# Patient Record
Sex: Male | Born: 1935 | Race: Black or African American | Hispanic: No | Marital: Married | Smoking: Former smoker
Health system: Southern US, Community
[De-identification: ages and names within clinical notes are randomized; demographics above are authoritative.]

## PROBLEM LIST (undated history)

## (undated) DIAGNOSIS — D75839 Thrombocytosis, unspecified: Secondary | ICD-10-CM

## (undated) DIAGNOSIS — D473 Essential (hemorrhagic) thrombocythemia: Secondary | ICD-10-CM

## (undated) DIAGNOSIS — E785 Hyperlipidemia, unspecified: Secondary | ICD-10-CM

## (undated) HISTORY — DX: Thrombocytosis, unspecified: D75.839

## (undated) HISTORY — PX: TONSILLECTOMY: SUR1361

## (undated) HISTORY — DX: Hyperlipidemia, unspecified: E78.5

## (undated) HISTORY — DX: Essential (hemorrhagic) thrombocythemia: D47.3

---
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
## 1998-01-31 HISTORY — PX: CARDIAC CATHETERIZATION: SHX172

## 1998-04-27 ENCOUNTER — Encounter: Payer: Self-pay | Admitting: Emergency Medicine

## 1998-04-27 ENCOUNTER — Inpatient Hospital Stay (HOSPITAL_COMMUNITY): Admission: EM | Admit: 1998-04-27 | Discharge: 1998-04-30 | Payer: Self-pay | Admitting: Emergency Medicine

## 2000-03-23 ENCOUNTER — Encounter: Payer: Self-pay | Admitting: Family Medicine

## 2000-03-23 ENCOUNTER — Encounter: Admission: RE | Admit: 2000-03-23 | Discharge: 2000-03-23 | Payer: Self-pay | Admitting: Family Medicine

## 2004-02-04 ENCOUNTER — Ambulatory Visit: Payer: Self-pay | Admitting: Oncology

## 2004-02-18 ENCOUNTER — Emergency Department (HOSPITAL_COMMUNITY): Admission: EM | Admit: 2004-02-18 | Discharge: 2004-02-18 | Payer: Self-pay | Admitting: Emergency Medicine

## 2004-05-04 ENCOUNTER — Ambulatory Visit: Payer: Self-pay | Admitting: Oncology

## 2004-08-02 ENCOUNTER — Ambulatory Visit: Payer: Self-pay | Admitting: Oncology

## 2004-11-02 ENCOUNTER — Ambulatory Visit: Payer: Self-pay | Admitting: Oncology

## 2004-12-27 ENCOUNTER — Encounter: Admission: RE | Admit: 2004-12-27 | Discharge: 2004-12-27 | Payer: Self-pay | Admitting: Family Medicine

## 2005-01-05 ENCOUNTER — Ambulatory Visit: Payer: Self-pay | Admitting: Oncology

## 2005-05-25 ENCOUNTER — Ambulatory Visit: Payer: Self-pay | Admitting: Oncology

## 2005-05-25 LAB — CBC WITH DIFFERENTIAL/PLATELET
Basophils Absolute: 0 10*3/uL (ref 0.0–0.1)
EOS%: 1.8 % (ref 0.0–7.0)
Eosinophils Absolute: 0.1 10*3/uL (ref 0.0–0.5)
HCT: 37.8 % — ABNORMAL LOW (ref 38.7–49.9)
HGB: 12.4 g/dL — ABNORMAL LOW (ref 13.0–17.1)
MONO#: 0.6 10*3/uL (ref 0.1–0.9)
NEUT#: 4.4 10*3/uL (ref 1.5–6.5)
NEUT%: 71.6 % (ref 40.0–75.0)
RDW: 15 % — ABNORMAL HIGH (ref 11.2–14.6)
WBC: 6.1 10*3/uL (ref 4.0–10.0)
lymph#: 1 10*3/uL (ref 0.9–3.3)

## 2005-06-03 LAB — CBC WITH DIFFERENTIAL/PLATELET
BASO%: 1 % (ref 0.0–2.0)
EOS%: 1.7 % (ref 0.0–7.0)
HCT: 42 % (ref 38.7–49.9)
MCH: 30.9 pg (ref 28.0–33.4)
MCHC: 33.5 g/dL (ref 32.0–35.9)
MONO#: 0.8 10*3/uL (ref 0.1–0.9)
NEUT%: 68.1 % (ref 40.0–75.0)
RDW: 14.8 % — ABNORMAL HIGH (ref 11.2–14.6)
WBC: 7.1 10*3/uL (ref 4.0–10.0)
lymph#: 1.3 10*3/uL (ref 0.9–3.3)

## 2005-08-02 ENCOUNTER — Ambulatory Visit: Payer: Self-pay | Admitting: Oncology

## 2005-08-10 LAB — CBC WITH DIFFERENTIAL/PLATELET
BASO%: 0.4 % (ref 0.0–2.0)
EOS%: 2.1 % (ref 0.0–7.0)
LYMPH%: 22.3 % (ref 14.0–48.0)
MCH: 31.6 pg (ref 28.0–33.4)
MCHC: 33.3 g/dL (ref 32.0–35.9)
MONO#: 0.8 10*3/uL (ref 0.1–0.9)
NEUT%: 62.3 % (ref 40.0–75.0)
Platelets: 256 10*3/uL (ref 145–400)
RBC: 4.06 10*6/uL — ABNORMAL LOW (ref 4.20–5.71)
WBC: 6.2 10*3/uL (ref 4.0–10.0)
lymph#: 1.4 10*3/uL (ref 0.9–3.3)

## 2005-11-02 ENCOUNTER — Ambulatory Visit: Payer: Self-pay | Admitting: Oncology

## 2005-11-23 ENCOUNTER — Encounter: Admission: RE | Admit: 2005-11-23 | Discharge: 2005-11-23 | Payer: Self-pay | Admitting: Family Medicine

## 2006-01-20 ENCOUNTER — Ambulatory Visit: Payer: Self-pay | Admitting: Gastroenterology

## 2006-02-02 ENCOUNTER — Ambulatory Visit: Payer: Self-pay | Admitting: Oncology

## 2006-02-08 ENCOUNTER — Ambulatory Visit: Payer: Self-pay | Admitting: Gastroenterology

## 2006-03-01 LAB — CBC WITH DIFFERENTIAL/PLATELET
Basophils Absolute: 0 10*3/uL (ref 0.0–0.1)
Eosinophils Absolute: 0.2 10*3/uL (ref 0.0–0.5)
HCT: 36.7 % — ABNORMAL LOW (ref 38.7–49.9)
HGB: 12.3 g/dL — ABNORMAL LOW (ref 13.0–17.1)
MCH: 31.8 pg (ref 28.0–33.4)
MCV: 95.1 fL (ref 81.6–98.0)
MONO%: 12.6 % (ref 0.0–13.0)
NEUT#: 3.3 10*3/uL (ref 1.5–6.5)
NEUT%: 63.7 % (ref 40.0–75.0)
Platelets: 239 10*3/uL (ref 145–400)
RDW: 15.2 % — ABNORMAL HIGH (ref 11.2–14.6)

## 2006-04-26 ENCOUNTER — Encounter: Admission: RE | Admit: 2006-04-26 | Discharge: 2006-04-26 | Payer: Self-pay | Admitting: Family Medicine

## 2006-05-05 ENCOUNTER — Ambulatory Visit: Payer: Self-pay | Admitting: Oncology

## 2006-05-15 LAB — CBC WITH DIFFERENTIAL/PLATELET
Basophils Absolute: 0 10*3/uL (ref 0.0–0.1)
EOS%: 2 % (ref 0.0–7.0)
Eosinophils Absolute: 0.1 10*3/uL (ref 0.0–0.5)
LYMPH%: 12.7 % — ABNORMAL LOW (ref 14.0–48.0)
MCH: 32.1 pg (ref 28.0–33.4)
MCV: 94.6 fL (ref 81.6–98.0)
MONO%: 10.8 % (ref 0.0–13.0)
NEUT#: 5.2 10*3/uL (ref 1.5–6.5)
Platelets: 293 10*3/uL (ref 145–400)
RBC: 3.77 10*6/uL — ABNORMAL LOW (ref 4.20–5.71)

## 2006-08-03 ENCOUNTER — Ambulatory Visit: Payer: Self-pay | Admitting: Oncology

## 2006-08-09 LAB — CBC WITH DIFFERENTIAL/PLATELET
BASO%: 0.5 % (ref 0.0–2.0)
Basophils Absolute: 0 10*3/uL (ref 0.0–0.1)
Eosinophils Absolute: 0.1 10*3/uL (ref 0.0–0.5)
HCT: 37.4 % — ABNORMAL LOW (ref 38.7–49.9)
HGB: 12.7 g/dL — ABNORMAL LOW (ref 13.0–17.1)
LYMPH%: 22.1 % (ref 14.0–48.0)
MCHC: 33.8 g/dL (ref 32.0–35.9)
MONO#: 0.7 10*3/uL (ref 0.1–0.9)
NEUT%: 62.1 % (ref 40.0–75.0)
Platelets: 237 10*3/uL (ref 145–400)
WBC: 5.5 10*3/uL (ref 4.0–10.0)
lymph#: 1.2 10*3/uL (ref 0.9–3.3)

## 2006-11-03 ENCOUNTER — Ambulatory Visit: Payer: Self-pay | Admitting: Oncology

## 2006-11-07 LAB — CBC WITH DIFFERENTIAL/PLATELET
BASO%: 1.1 % (ref 0.0–2.0)
Basophils Absolute: 0.1 10*3/uL (ref 0.0–0.1)
EOS%: 2.1 % (ref 0.0–7.0)
HCT: 38.2 % — ABNORMAL LOW (ref 38.7–49.9)
HGB: 12.8 g/dL — ABNORMAL LOW (ref 13.0–17.1)
LYMPH%: 19.5 % (ref 14.0–48.0)
MCH: 32.2 pg (ref 28.0–33.4)
MCHC: 33.6 g/dL (ref 32.0–35.9)
MCV: 95.9 fL (ref 81.6–98.0)
MONO%: 14.2 % — ABNORMAL HIGH (ref 0.0–13.0)
NEUT%: 63 % (ref 40.0–75.0)
Platelets: 184 10*3/uL (ref 145–400)

## 2006-12-11 ENCOUNTER — Encounter: Admission: RE | Admit: 2006-12-11 | Discharge: 2006-12-11 | Payer: Self-pay | Admitting: Family Medicine

## 2007-02-05 ENCOUNTER — Ambulatory Visit: Payer: Self-pay | Admitting: Oncology

## 2007-02-23 LAB — CBC WITH DIFFERENTIAL/PLATELET
BASO%: 0.9 % (ref 0.0–2.0)
Basophils Absolute: 0 10*3/uL (ref 0.0–0.1)
EOS%: 2.4 % (ref 0.0–7.0)
HGB: 12.9 g/dL — ABNORMAL LOW (ref 13.0–17.1)
MCH: 31.9 pg (ref 28.0–33.4)
MCHC: 34.5 g/dL (ref 32.0–35.9)
MCV: 92.3 fL (ref 81.6–98.0)
MONO%: 10.8 % (ref 0.0–13.0)
NEUT%: 63.2 % (ref 40.0–75.0)
RDW: 12.6 % (ref 11.2–14.6)
lymph#: 1.3 10*3/uL (ref 0.9–3.3)

## 2007-02-23 LAB — MORPHOLOGY

## 2007-05-04 ENCOUNTER — Ambulatory Visit: Payer: Self-pay | Admitting: Oncology

## 2007-05-08 LAB — CBC WITH DIFFERENTIAL/PLATELET
BASO%: 1 % (ref 0.0–2.0)
Basophils Absolute: 0.1 10*3/uL (ref 0.0–0.1)
EOS%: 1.8 % (ref 0.0–7.0)
HCT: 41 % (ref 38.7–49.9)
HGB: 13.5 g/dL (ref 13.0–17.1)
LYMPH%: 20.6 % (ref 14.0–48.0)
MCH: 32.1 pg (ref 28.0–33.4)
MCHC: 32.8 g/dL (ref 32.0–35.9)
MCV: 97.8 fL (ref 81.6–98.0)
MONO%: 13.5 % — ABNORMAL HIGH (ref 0.0–13.0)
NEUT%: 63.2 % (ref 40.0–75.0)
lymph#: 1.3 10*3/uL (ref 0.9–3.3)

## 2007-05-08 LAB — CHCC SMEAR

## 2007-08-02 ENCOUNTER — Ambulatory Visit: Payer: Self-pay | Admitting: Oncology

## 2007-08-07 LAB — CBC WITH DIFFERENTIAL/PLATELET
BASO%: 1 % (ref 0.0–2.0)
EOS%: 4.2 % (ref 0.0–7.0)
LYMPH%: 15.7 % (ref 14.0–48.0)
MCH: 31.8 pg (ref 28.0–33.4)
MCHC: 33.3 g/dL (ref 32.0–35.9)
MONO#: 0.9 10*3/uL (ref 0.1–0.9)
NEUT%: 64.5 % (ref 40.0–75.0)
RBC: 4.1 10*6/uL — ABNORMAL LOW (ref 4.20–5.71)
WBC: 5.8 10*3/uL (ref 4.0–10.0)
lymph#: 0.9 10*3/uL (ref 0.9–3.3)

## 2007-08-07 LAB — CHCC SMEAR

## 2007-08-07 LAB — MORPHOLOGY: PLT EST: ADEQUATE

## 2007-11-05 ENCOUNTER — Ambulatory Visit: Payer: Self-pay | Admitting: Oncology

## 2007-11-07 ENCOUNTER — Ambulatory Visit (HOSPITAL_COMMUNITY): Admission: RE | Admit: 2007-11-07 | Discharge: 2007-11-07 | Payer: Self-pay | Admitting: Oncology

## 2007-11-07 LAB — MORPHOLOGY: PLT EST: ADEQUATE

## 2007-11-07 LAB — CBC WITH DIFFERENTIAL/PLATELET
BASO%: 1.4 % (ref 0.0–2.0)
Basophils Absolute: 0.1 10*3/uL (ref 0.0–0.1)
EOS%: 2.1 % (ref 0.0–7.0)
Eosinophils Absolute: 0.1 10*3/uL (ref 0.0–0.5)
HCT: 40.3 % (ref 38.7–49.9)
HGB: 13.5 g/dL (ref 13.0–17.1)
MCH: 32.1 pg (ref 28.0–33.4)
MCV: 95.8 fL (ref 81.6–98.0)
MONO%: 10.3 % (ref 0.0–13.0)
NEUT%: 65.9 % (ref 40.0–75.0)
Platelets: 194 10*3/uL (ref 145–400)
WBC: 5.5 10*3/uL (ref 4.0–10.0)

## 2007-11-07 LAB — CHCC SMEAR

## 2008-03-03 ENCOUNTER — Ambulatory Visit: Payer: Self-pay | Admitting: Oncology

## 2008-03-10 LAB — CBC WITH DIFFERENTIAL/PLATELET
Basophils Absolute: 0 10*3/uL (ref 0.0–0.1)
Eosinophils Absolute: 0.1 10*3/uL (ref 0.0–0.5)
HCT: 38 % — ABNORMAL LOW (ref 38.7–49.9)
HGB: 12.8 g/dL — ABNORMAL LOW (ref 13.0–17.1)
MCV: 96.6 fL (ref 81.6–98.0)
Platelets: 210 10*3/uL (ref 145–400)
RBC: 3.93 10*6/uL — ABNORMAL LOW (ref 4.20–5.71)
RDW: 14.8 % — ABNORMAL HIGH (ref 11.2–14.6)

## 2008-06-27 ENCOUNTER — Ambulatory Visit: Payer: Self-pay | Admitting: Oncology

## 2008-07-02 LAB — CBC WITH DIFFERENTIAL/PLATELET
BASO%: 0.5 % (ref 0.0–2.0)
Basophils Absolute: 0 10*3/uL (ref 0.0–0.1)
LYMPH%: 21.3 % (ref 14.0–49.0)
MCH: 31.6 pg (ref 27.2–33.4)
NEUT#: 4.1 10*3/uL (ref 1.5–6.5)
NEUT%: 67.8 % (ref 39.0–75.0)
Platelets: 160 10*3/uL (ref 140–400)
RDW: 14.3 % (ref 11.0–14.6)
WBC: 6.1 10*3/uL (ref 4.0–10.3)
lymph#: 1.3 10*3/uL (ref 0.9–3.3)

## 2008-09-04 IMAGING — CR DG CHEST 2V
2 series · 2 of 2 positions shown · non-contrast
Comparison: 11/23/05.

CLINICAL DATA: Acute bronchitis.  Cough, congestion, and wheezing. 
 CHEST ? 2 VIEW:

[w chest pa]
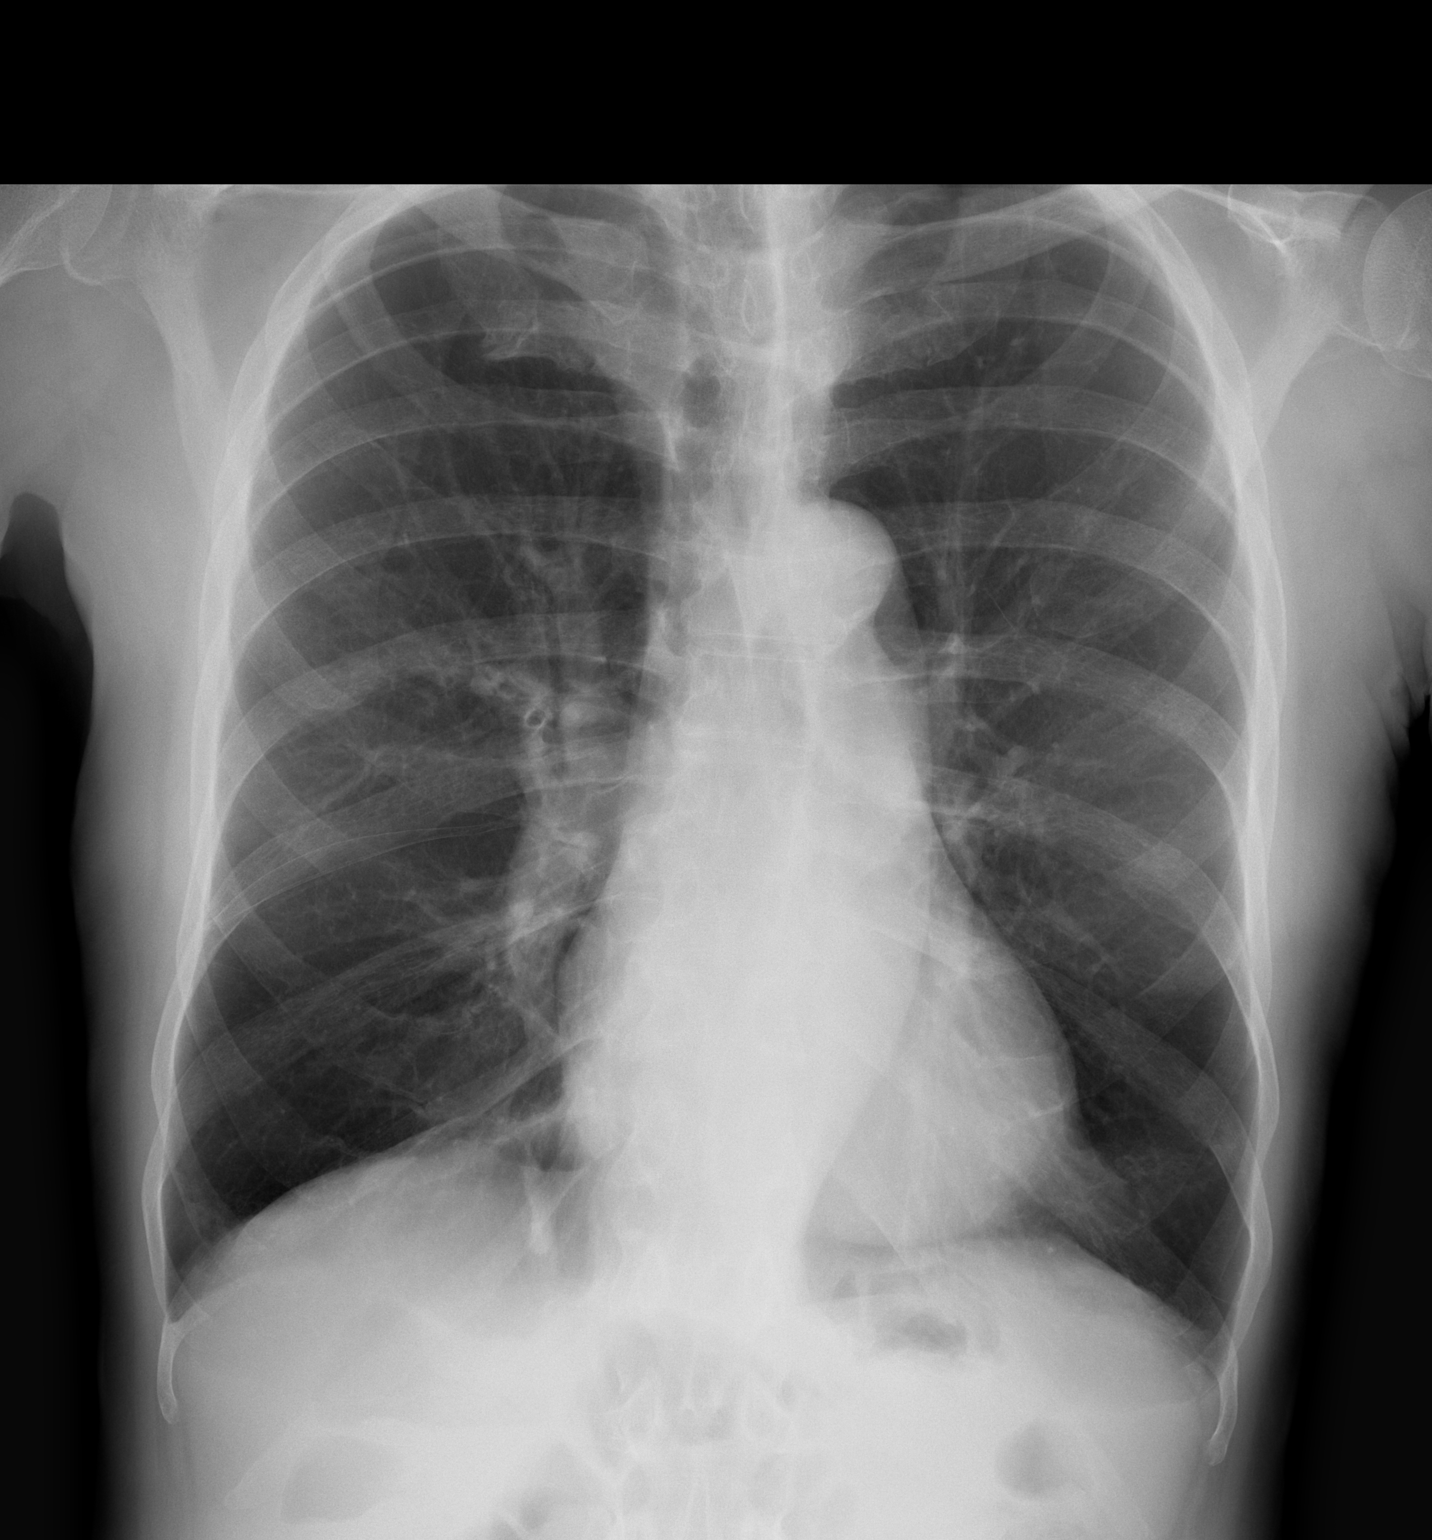

[w chest lat]
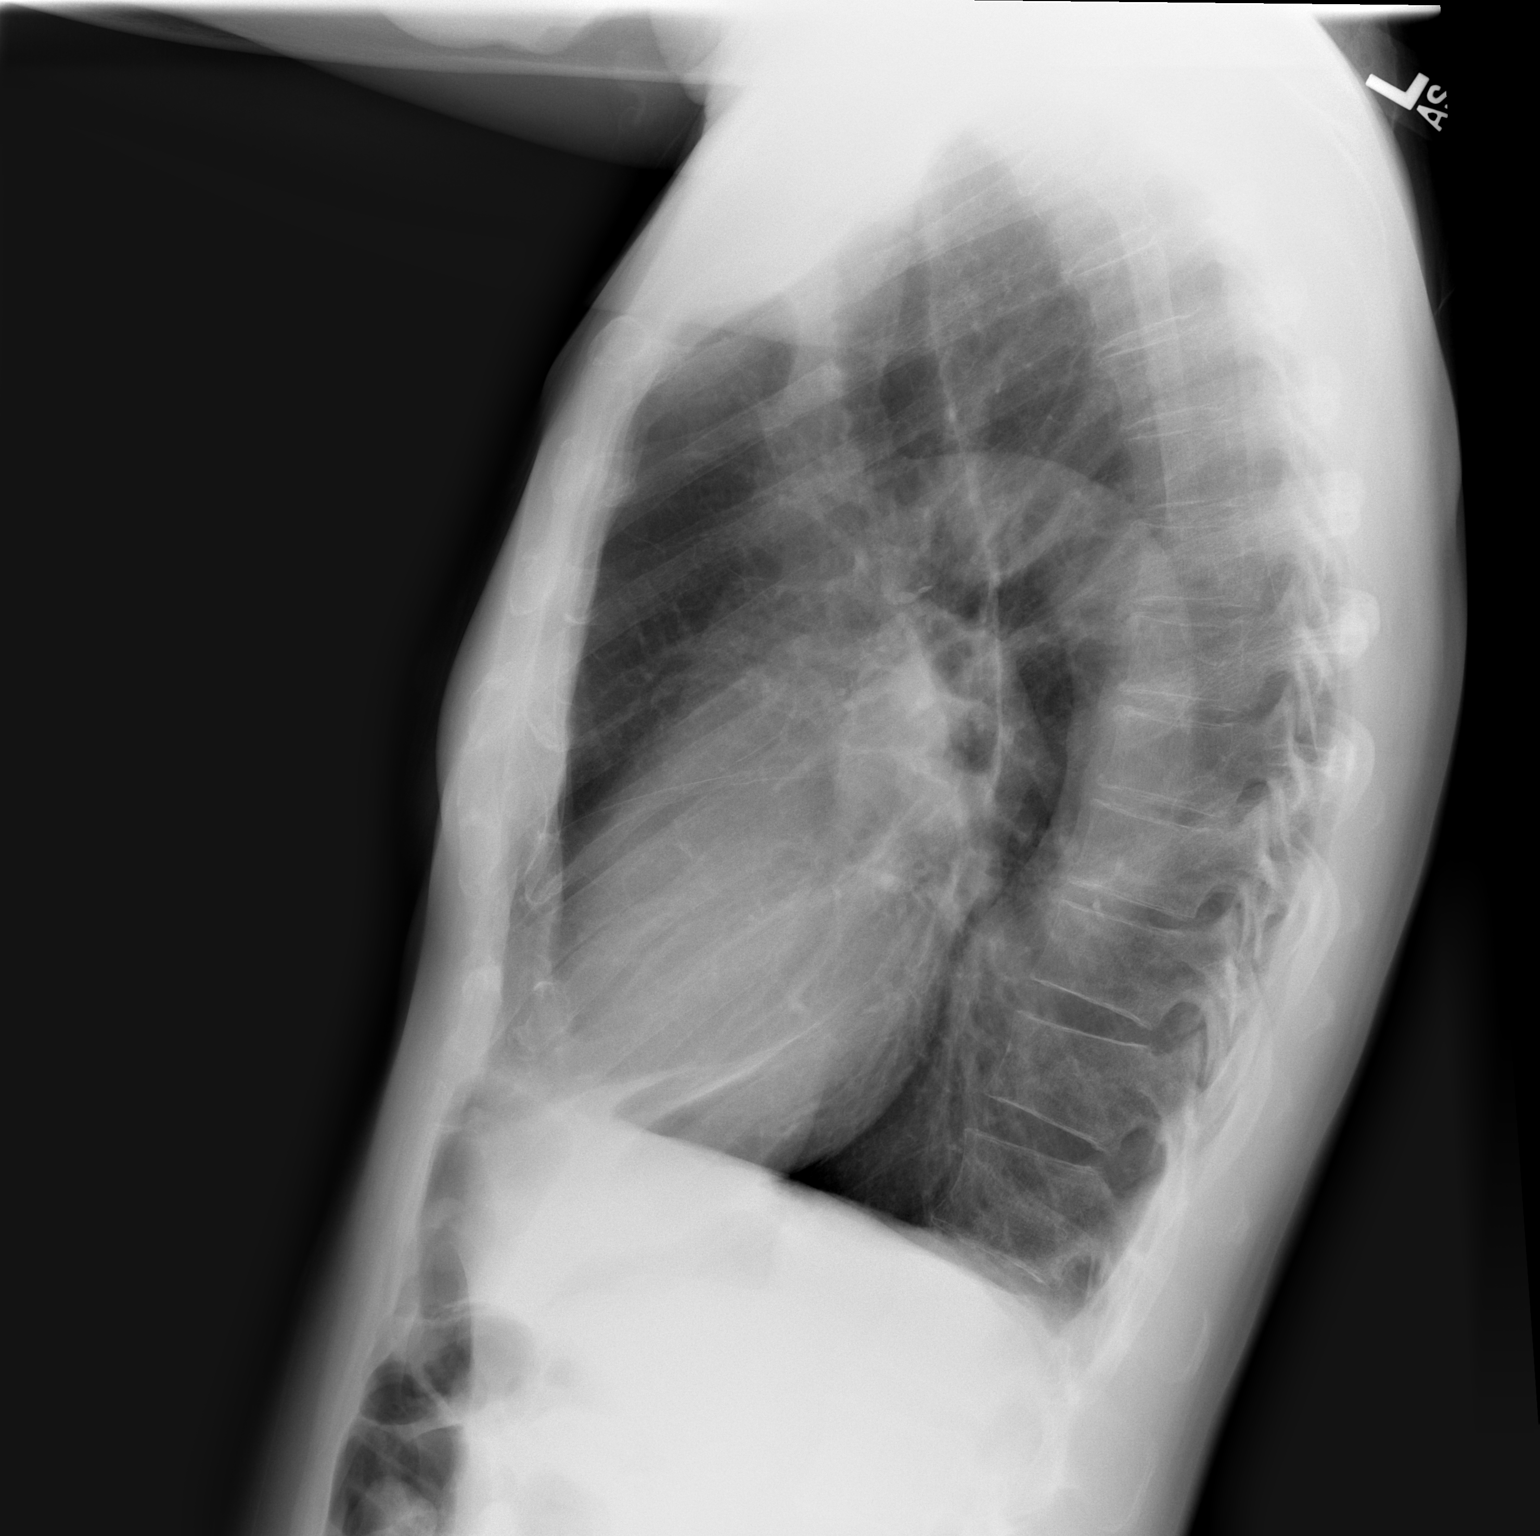

[2 of 2 positions shown; findings below may reference images not displayed]

FINDINGS: Heart size is normal.  There are no effusions or edema.  No focal airspace opacities are identified.
IMPRESSION: No active disease.

## 2008-10-27 ENCOUNTER — Ambulatory Visit: Payer: Self-pay | Admitting: Oncology

## 2008-10-29 LAB — CBC WITH DIFFERENTIAL/PLATELET
Eosinophils Absolute: 0.1 10*3/uL (ref 0.0–0.5)
LYMPH%: 27.1 % (ref 14.0–49.0)
MCHC: 34.3 g/dL (ref 32.0–36.0)
MONO#: 0.6 10*3/uL (ref 0.1–0.9)
MONO%: 10.8 % (ref 0.0–14.0)
NEUT%: 60 % (ref 39.0–75.0)
RBC: 4.23 10*6/uL (ref 4.20–5.82)
WBC: 5.3 10*3/uL (ref 4.0–10.3)

## 2008-10-29 LAB — MORPHOLOGY: PLT EST: ADEQUATE

## 2008-10-29 LAB — CHCC SMEAR

## 2009-03-10 ENCOUNTER — Ambulatory Visit: Payer: Self-pay | Admitting: Oncology

## 2009-03-13 LAB — CBC WITH DIFFERENTIAL/PLATELET
Basophils Absolute: 0.1 10*3/uL (ref 0.0–0.1)
EOS%: 1.5 % (ref 0.0–7.0)
HCT: 40.4 % (ref 38.4–49.9)
HGB: 13.4 g/dL (ref 13.0–17.1)
MCHC: 33.2 g/dL (ref 32.0–36.0)
MCV: 95.5 fL (ref 79.3–98.0)
MONO#: 0.5 10*3/uL (ref 0.1–0.9)
NEUT%: 63.2 % (ref 39.0–75.0)
Platelets: 205 10*3/uL (ref 140–400)
WBC: 6.1 10*3/uL (ref 4.0–10.3)
nRBC: 0 % (ref 0–0)

## 2009-07-01 ENCOUNTER — Ambulatory Visit: Payer: Self-pay | Admitting: Oncology

## 2009-10-08 ENCOUNTER — Encounter: Admission: RE | Admit: 2009-10-08 | Discharge: 2009-10-08 | Payer: Self-pay | Admitting: Cardiology

## 2009-10-08 ENCOUNTER — Ambulatory Visit: Payer: Self-pay | Admitting: Cardiology

## 2009-10-08 ENCOUNTER — Ambulatory Visit (HOSPITAL_COMMUNITY): Admission: RE | Admit: 2009-10-08 | Discharge: 2009-10-08 | Payer: Self-pay | Admitting: Cardiology

## 2009-10-20 ENCOUNTER — Ambulatory Visit: Payer: Self-pay | Admitting: Oncology

## 2009-10-22 LAB — CBC WITH DIFFERENTIAL/PLATELET
Eosinophils Absolute: 0.1 10*3/uL (ref 0.0–0.5)
HCT: 37.3 % — ABNORMAL LOW (ref 38.4–49.9)
LYMPH%: 18.3 % (ref 14.0–49.0)
MCHC: 33 g/dL (ref 32.0–36.0)
MCV: 95.6 fL (ref 79.3–98.0)
NEUT%: 70 % (ref 39.0–75.0)
Platelets: 171 10*3/uL (ref 140–400)
RBC: 3.9 10*6/uL — ABNORMAL LOW (ref 4.20–5.82)
RDW: 14.7 % — ABNORMAL HIGH (ref 11.0–14.6)
lymph#: 1.1 10*3/uL (ref 0.9–3.3)

## 2010-02-05 ENCOUNTER — Ambulatory Visit: Payer: Self-pay | Admitting: Oncology

## 2010-02-22 ENCOUNTER — Encounter
Admission: RE | Admit: 2010-02-22 | Discharge: 2010-02-22 | Payer: Self-pay | Source: Home / Self Care | Attending: Family Medicine | Admitting: Family Medicine

## 2010-03-02 LAB — CBC WITH DIFFERENTIAL/PLATELET
Basophils Absolute: 0 10*3/uL (ref 0.0–0.1)
EOS%: 1.2 % (ref 0.0–7.0)
LYMPH%: 21.8 % (ref 14.0–49.0)
MCV: 95.6 fL (ref 79.3–98.0)
MONO#: 0.5 10*3/uL (ref 0.1–0.9)
MONO%: 7.9 % (ref 0.0–14.0)
NEUT#: 4.1 10*3/uL (ref 1.5–6.5)
NEUT%: 68.8 % (ref 39.0–75.0)
RBC: 3.63 10*6/uL — ABNORMAL LOW (ref 4.20–5.82)
WBC: 5.9 10*3/uL (ref 4.0–10.3)
lymph#: 1.3 10*3/uL (ref 0.9–3.3)
nRBC: 0 % (ref 0–0)

## 2010-03-22 ENCOUNTER — Other Ambulatory Visit: Payer: Self-pay | Admitting: Oncology

## 2010-03-22 ENCOUNTER — Encounter (HOSPITAL_BASED_OUTPATIENT_CLINIC_OR_DEPARTMENT_OTHER): Payer: Medicare Other | Admitting: Oncology

## 2010-03-22 DIAGNOSIS — D473 Essential (hemorrhagic) thrombocythemia: Secondary | ICD-10-CM

## 2010-03-22 LAB — CBC WITH DIFFERENTIAL/PLATELET
BASO%: 0.4 % (ref 0.0–2.0)
Basophils Absolute: 0 10*3/uL (ref 0.0–0.1)
EOS%: 1.9 % (ref 0.0–7.0)
Eosinophils Absolute: 0.1 10*3/uL (ref 0.0–0.5)
HCT: 36.8 % — ABNORMAL LOW (ref 38.4–49.9)
NEUT#: 4.6 10*3/uL (ref 1.5–6.5)
NEUT%: 67.5 % (ref 39.0–75.0)
Platelets: 190 10*3/uL (ref 140–400)
RBC: 3.86 10*6/uL — ABNORMAL LOW (ref 4.20–5.82)
nRBC: 0 % (ref 0–0)

## 2010-04-14 ENCOUNTER — Institutional Professional Consult (permissible substitution) (INDEPENDENT_AMBULATORY_CARE_PROVIDER_SITE_OTHER): Payer: Medicare Other | Admitting: Internal Medicine

## 2010-04-14 ENCOUNTER — Encounter: Payer: Self-pay | Admitting: Internal Medicine

## 2010-04-14 DIAGNOSIS — E119 Type 2 diabetes mellitus without complications: Secondary | ICD-10-CM | POA: Insufficient documentation

## 2010-04-14 DIAGNOSIS — J439 Emphysema, unspecified: Secondary | ICD-10-CM | POA: Insufficient documentation

## 2010-04-14 DIAGNOSIS — J449 Chronic obstructive pulmonary disease, unspecified: Secondary | ICD-10-CM

## 2010-04-14 DIAGNOSIS — I10 Essential (primary) hypertension: Secondary | ICD-10-CM | POA: Insufficient documentation

## 2010-04-20 NOTE — Assessment & Plan Note (Signed)
Summary: abnormal cxr ///kp   Primary Provider/Referring Provider:  A.V.Blount  CC:  Pulmonary Consult-Abnormal CXR.-Dr. Blunt.  History of Present Illness: April 14, 2010-  80yoM former smoker referred courtesy of Dr Bruna Potter for pulmonary evaluation, concerned about abnormal chest exam and CXR. Smoked almost 50 yrs, ending 2002. Quit during an episdoe of bronchitis. Says Dr Bruna Potter heard abnormal sounds in right lateral chest on recent exam.  CXR 02/21/10- Hyperinflation/ COPD, upper Nl heart size, NAD. Some DOE on hills and stairs, but he sings in 3 chirs. No wheeze. Coughs only if nose starts running. Acute bronchitis about once/ year. Little day-to-day change, little phlegm. Denies enlarged glands, blood, fever, chills, purulence. Right foot may swell a little. Dr Deborah Chalk has treated for CHF, with hx hypertension. No Mi. Stress tests and cardiac cath "ok". Pneumonia 2000. had pneumovax and annual flu vax. Some sinus p[ressure/ congestion. Hx thrombocytosis- Hgb ranging 12-13.   Preventive Screening-Counseling & Management  Alcohol-Tobacco     Alcohol drinks/day: 1     Alcohol type: wine     Smoking Status: quit     Packs/Day: 1.0     Year Started: 1947     Year Quit: 2002     Pack years: 55 years  Current Medications (verified): 1)  Hydroxyurea 500 Mg Caps (Hydroxyurea) .... Take 1 By Mouth Once Daily 2)  Furosemide 40 Mg Tabs (Furosemide) .... Take 1 By Mouth Once Daily 3)  Metformin Hcl 1000 Mg Tabs (Metformin Hcl) .... Take 1 By Mouth Two Times A Day 4)  Glipizide 10 Mg Tabs (Glipizide) .... Two Times A Day 5)  Lotensin 10 Mg Tabs (Benazepril Hcl) .... Take 1 By Mouth Once Daily 6)  Carvedilol 12.5 Mg Tabs (Carvedilol) .... Take 1 By Mouth Two Times A Day 7)  Centrum Silver  Tabs (Multiple Vitamins-Minerals) .... Take 1 By Mouth Once Daily 8)  Aspirin 81 Mg Tbec (Aspirin) .... Take 1 By Mouth Once Daily  Allergies (verified): No Known Drug Allergies  Past History:  Past  Medical History: COPD CHF Diabetes, Type 2 Hypertension Thrombocytosis  Past Surgical History: Cataract Extraction Tonsillectomy Orchopexy after football injury  Family History: Son: allergies and childhood asthma Father: stomach cancer(smoker and ETOH use).  Social History: Married with children; 2 deceased and 2 living Retired Banker x 45 years), Astronomer ETOH-occassional wine Ex Smoker Smoking Status:  quit Pack years:  55 years Alcohol drinks/day:  1 Packs/Day:  1.0  Review of Systems      See HPI       The patient complains of shortness of breath with activity, productive cough, non-productive cough, sneezing, and rash.  The patient denies shortness of breath at rest, coughing up blood, chest pain, irregular heartbeats, acid heartburn, indigestion, loss of appetite, weight change, abdominal pain, difficulty swallowing, sore throat, tooth/dental problems, headaches, nasal congestion/difficulty breathing through nose, itching, ear ache, anxiety, depression, hand/feet swelling, joint stiffness or pain, change in color of mucus, and fever.    Vital Signs:  Patient profile:   77 year old male Weight:      147 pounds O2 Sat:      99 % on Room air Pulse rate:   82 / minute BP sitting:   110 / 60  (left arm) Cuff size:   regular  Vitals Entered By: Reynaldo Minium CMA (April 14, 2010 10:19 AM)  O2 Flow:  Room air CC: Pulmonary Consult-Abnormal CXR.-Dr. Blunt   Physical Exam  Additional Exam:  General: A/Ox3; pleasant  and cooperative, NAD, tall, lean SKIN: no rash, lesions NODES: no lymphadenopathy HEENT: [place]/AT, EOM- WNL, Conjuctivae- clear, PERRLA, TM-WNL, Nose- clear, Throat- clear and wnl, Mallampati  II NECK: Supple w/ fair ROM, JVD- none, normal carotid impulses w/o bruits Thyroid- normal to palpation CHEST: Clear to P&A, distant, unlabored HEART: RRR, no m/g/r heard ABDOMEN: Soft and nl; nml bowel sounds; no organomegaly or masses noted WUJ:WJXB, nl pulses,  trace edema  NEURO: Grossly intact to observation      Impression & Recommendations:  Problem # 1:  COPD (ICD-496) CXR and long smoking hx favor that he has some degree of COPD, and that probably what Dr Bruna Potter noted on exam was reflective of some degree of bronchitis at the time.  We will assess his functional capacity with PFT and let him try Spirva sample to see what effect it has.  Role of CHF seems minor reason for exertional dypnea now.. Thrombocytopenia associated with mild anemia- not enough to bring significant dyspnea.   Medications Added to Medication List This Visit: 1)  Hydroxyurea 500 Mg Caps (Hydroxyurea) .... Take 1 by mouth once daily 2)  Furosemide 40 Mg Tabs (Furosemide) .... Take 1 by mouth once daily 3)  Metformin Hcl 1000 Mg Tabs (Metformin hcl) .... Take 1 by mouth two times a day 4)  Glipizide 10 Mg Tabs (Glipizide) .... Two times a day 5)  Lotensin 10 Mg Tabs (Benazepril hcl) .... Take 1 by mouth once daily 6)  Carvedilol 12.5 Mg Tabs (Carvedilol) .... Take 1 by mouth two times a day 7)  Centrum Silver Tabs (Multiple vitamins-minerals) .... Take 1 by mouth once daily 8)  Aspirin 81 Mg Tbec (Aspirin) .... Take 1 by mouth once daily  Other Orders: Consultation Level III (14782)  Patient Instructions: 1)  Please schedule a follow-up appointment in 1 month. 2)  Schedule PFT 3)  Sample Spiriva- 1 daily 4)  This is a gentle medicine. Use up the sample, then stop and see if you can judge any effect on breathing or shortness of breath.  5)  cc Dr Bruna Potter

## 2010-04-28 ENCOUNTER — Other Ambulatory Visit: Payer: Medicare Other | Admitting: *Deleted

## 2010-04-29 ENCOUNTER — Other Ambulatory Visit (INDEPENDENT_AMBULATORY_CARE_PROVIDER_SITE_OTHER): Payer: Medicare Other | Admitting: *Deleted

## 2010-04-29 ENCOUNTER — Other Ambulatory Visit: Payer: Medicare Other | Admitting: *Deleted

## 2010-04-29 DIAGNOSIS — E7800 Pure hypercholesterolemia, unspecified: Secondary | ICD-10-CM

## 2010-04-29 DIAGNOSIS — Z79899 Other long term (current) drug therapy: Secondary | ICD-10-CM

## 2010-04-29 LAB — BASIC METABOLIC PANEL
BUN: 18 mg/dL (ref 6–23)
CO2: 30 mEq/L (ref 19–32)
Calcium: 9 mg/dL (ref 8.4–10.5)
Chloride: 106 mEq/L (ref 96–112)
Glucose, Bld: 168 mg/dL — ABNORMAL HIGH (ref 70–99)
Potassium: 4.1 mEq/L (ref 3.5–5.1)
Sodium: 142 mEq/L (ref 135–145)

## 2010-04-29 LAB — HEPATIC FUNCTION PANEL
ALT: 25 U/L (ref 0–53)
Alkaline Phosphatase: 55 U/L (ref 39–117)
Bilirubin, Direct: 0.1 mg/dL (ref 0.0–0.3)

## 2010-04-29 LAB — LIPID PANEL
Cholesterol: 163 mg/dL (ref 0–200)
Triglycerides: 84 mg/dL (ref 0.0–149.0)

## 2010-05-03 ENCOUNTER — Telehealth: Payer: Self-pay | Admitting: *Deleted

## 2010-05-03 NOTE — Telephone Encounter (Signed)
Labs reported to pt 

## 2010-05-20 ENCOUNTER — Ambulatory Visit (INDEPENDENT_AMBULATORY_CARE_PROVIDER_SITE_OTHER): Payer: Medicare Other | Admitting: Internal Medicine

## 2010-05-20 DIAGNOSIS — J449 Chronic obstructive pulmonary disease, unspecified: Secondary | ICD-10-CM

## 2010-05-20 DIAGNOSIS — J4489 Other specified chronic obstructive pulmonary disease: Secondary | ICD-10-CM

## 2010-05-20 LAB — PULMONARY FUNCTION TEST

## 2010-05-20 NOTE — Progress Notes (Signed)
PFT done today. 

## 2010-05-25 ENCOUNTER — Encounter: Payer: Self-pay | Admitting: Internal Medicine

## 2010-05-31 ENCOUNTER — Encounter: Payer: Self-pay | Admitting: Cardiology

## 2010-05-31 DIAGNOSIS — I509 Heart failure, unspecified: Secondary | ICD-10-CM | POA: Insufficient documentation

## 2010-05-31 DIAGNOSIS — E785 Hyperlipidemia, unspecified: Secondary | ICD-10-CM | POA: Insufficient documentation

## 2010-05-31 DIAGNOSIS — D473 Essential (hemorrhagic) thrombocythemia: Secondary | ICD-10-CM | POA: Insufficient documentation

## 2010-05-31 DIAGNOSIS — I429 Cardiomyopathy, unspecified: Secondary | ICD-10-CM | POA: Insufficient documentation

## 2010-05-31 DIAGNOSIS — H538 Other visual disturbances: Secondary | ICD-10-CM | POA: Insufficient documentation

## 2010-06-01 ENCOUNTER — Encounter: Payer: Self-pay | Admitting: Internal Medicine

## 2010-06-03 ENCOUNTER — Encounter: Payer: Self-pay | Admitting: Internal Medicine

## 2010-06-03 ENCOUNTER — Ambulatory Visit (INDEPENDENT_AMBULATORY_CARE_PROVIDER_SITE_OTHER): Payer: Medicare Other | Admitting: Internal Medicine

## 2010-06-03 VITALS — BP 100/64 | HR 74 | Ht 71.5 in | Wt 144.8 lb

## 2010-06-03 DIAGNOSIS — J449 Chronic obstructive pulmonary disease, unspecified: Secondary | ICD-10-CM

## 2010-06-03 MED ORDER — FLUTICASONE-SALMETEROL 100-50 MCG/DOSE IN AEPB: 1.0000 | INHALATION_SPRAY | Freq: Two times a day (BID) | RESPIRATORY_TRACT | Status: DC

## 2010-06-03 NOTE — Assessment & Plan Note (Addendum)
Severe COPD with little response to bronchodilator. We will try replacing Spiriva, since it didn't do much, with a trial of Advair. It may be that walking and his singing will be the best  for him now. Mild seasonal rhinitis points to a modest atopic allergy component.

## 2010-06-03 NOTE — Patient Instructions (Signed)
Spiriva didn't seem to help, so we will stop that.  Try Advair 100/50 sample and script-  1 puff and rinse mouth, twice daily

## 2010-06-03 NOTE — Progress Notes (Signed)
  Subjective:    Patient ID: Darrell Burnett, male    DOB: July 27, 1929, 77 y.o.   MRN: [id number]  HPI 06/03/10-77 yoM former smoker  followed for COPD complicated by hx of CHF- Dr Deborah Chalk.  Last here April 14, 2010 for initial visit. We tried Spiriva but he couldn't tell much difference.  Blames pollen for postnasal drip causing some cough. Denies fever, sore throat. PFT 05/20/10 reviewed with him- severe obstructive disease, FEV1 1.33/ 48%, FEV1/FVC 0.45, slight response to bronchodilator, RV 147, DLCO 0.72.    Review of Systems See HPI Constitutional:   No weight loss, night sweats,  Fevers, chills, fatigue, lassitude. HEENT:   No headaches,  Difficulty swallowing,  Tooth/dental problems,  Sore throat,                No sneezing, itching, ear ache, nasal congestion  CV:  No chest pain,  Orthopnea, PND, swelling in lower extremities, anasarca, dizziness, palpitations  GI  No heartburn, indigestion, abdominal pain, nausea, vomiting, diarrhea, change in bowel habits, loss of appetite  Resp: No shortness of breath with exertion or at rest.  No excess mucus,   No coughing up of blood.  No change in color of mucus.  No wheezing.   Skin: no rash or lesions.  GU: no dysuria, change in color of urine, no urgency or frequency.  No flank pain.  MS:  No joint pain or swelling.  No decreased range of motion.  No back pain.  Psych:  No change in mood or affect. No depression or anxiety.  No memory loss.      Objective:   Physical Exam General- Alert, Oriented, Affect-appropriate, Distress- none acute  Tall, slim, casual  Skin- rash-none, lesions- none, excoriation- none  Lymphadenopathy- none  Head- atraumatic  Eyes- Gross vision intact, PERRLA, conjunctivae clear, secretions  Ears- Normal-  Hearing, canals, Tm   Nose- Clear, No-  Septal dev, mucus, polyps, erosion, perforation   Throat- Mallampati II , mucosa clear , drainage- none, tonsils- atrophic  Neck- flexible , trachea  midline, no stridor , thyroid nl, carotid no bruit  Chest - symmetrical excursion , unlabored     Heart/CV- RRR , no murmur , no gallop  , no rub, nl s1 s2                     - JVD- none , edema- none, stasis changes- none, varices- none     Lung- clear to P&A  Diminished,      wheeze- none, cough- none , dullness-none, rub- none     Chest wall-   Abd- tender-no, distended-no, bowel sounds-present, HSM- no  Br/ Gen/ Rectal- Not done, not indicated  Extrem- cyanosis- none, clubbing, none, atrophy- none, strength- nl  Neuro- grossly intact to observation         Assessment & Plan:

## 2010-06-07 ENCOUNTER — Encounter: Payer: Self-pay | Admitting: Cardiology

## 2010-06-07 ENCOUNTER — Ambulatory Visit (INDEPENDENT_AMBULATORY_CARE_PROVIDER_SITE_OTHER): Payer: Medicare Other | Admitting: Cardiology

## 2010-06-07 DIAGNOSIS — R0602 Shortness of breath: Secondary | ICD-10-CM

## 2010-06-07 DIAGNOSIS — I428 Other cardiomyopathies: Secondary | ICD-10-CM

## 2010-06-07 NOTE — Assessment & Plan Note (Signed)
EKG today shows nonspecific interventricular conduction block with nonspecific ST and T-wave changes this is unchanged from his previous study. He does have some known underlying COPD. His diabetes has been well-controlled and not contributing to any symptoms. For now will make no changes. He had a 2-D echocardiogram done in September 2011 which showed interventricular septum of 19 mm and LV posterior wall of 12 mm with a left atrial size of 44 mm. Overall, he's doing well. I'll have him see Dr. Jens Som in 8 months

## 2010-06-07 NOTE — Progress Notes (Signed)
Subjective:   Darrell Burnett is seen today for followup visit. He does have some shortness of breath is probably related to his underlying COPD. He has a history of asymmetric septal hypertrophy and a hypertrophic cardiomyopathy. He does have a history of diabetes. Remotely, he had a history of alcohol use that was felt to be related to heart failure and cardiomyopathy. He is no longer drinking. He does have underlying diabetes, hyperlipemia, and thrombocytosis followed at Dr. Arlice Colt.  He has hyperlipidemia his only able to tolerate Zetia. He has diabetes. His thrombocytosis has been treated with hydroxyurea he  Current Outpatient Prescriptions  Medication Sig Dispense Refill  . aspirin 325 MG tablet Take 325 mg by mouth daily.        . benazepril (LOTENSIN) 10 MG tablet Take 10 mg by mouth daily.        . carvedilol (COREG) 12.5 MG tablet Take 12.5 mg by mouth 2 (two) times daily with a meal.        . clobetasol (TEMOVATE) 0.05 % ointment As directed       . ezetimibe (ZETIA) 10 MG tablet Take 10 mg by mouth daily.        . Fluticasone-Salmeterol (ADVAIR DISKUS) 100-50 MCG/DOSE AEPB Inhale 1 puff into the lungs 2 (two) times daily. Rinse mouth  1 each  prn  . furosemide (LASIX) 40 MG tablet Take 40 mg by mouth daily.        Marland Kitchen glipiZIDE (GLUCOTROL) 10 MG tablet Take 10 mg by mouth 2 (two) times daily before a meal.        . hydroxyurea (HYDREA) 500 MG capsule Take 500 mg by mouth daily. May take with food to minimize GI side effects. AS DIRECTED       . metFORMIN (GLUCOPHAGE) 1000 MG tablet Take 1,000 mg by mouth 2 (two) times daily with a meal.        . Multiple Vitamin (MULTIVITAMIN) tablet Take 1 tablet by mouth daily.        . prednisoLONE acetate (PRED FORTE) 1 % ophthalmic suspension As directed         No Known Allergies  Patient Active Problem List  Diagnoses  . DIABETES, TYPE 2  . HYPERTENSION  . COPD  . Hyperlipidemia  . Diabetes mellitus  . Cardiomyopathy  . Thrombocytosis  .  Blurred vision  . Heart failure    History  Smoking status  . Former Smoker -- 1.0 packs/day for 45 years  . Types: Cigarettes  . Quit date: 02/01/2000  Smokeless tobacco  . Never Used    History  Alcohol Use  . 2.4 oz/week  . 4 Glasses of wine per week    Family History  Problem Relation Age of Onset  . Stomach cancer Father   . Asthma Son     Review of Systems:   The patient denies any heat or cold intolerance.  No weight gain or weight loss.  The patient denies headaches or blurry vision.  There is no cough or sputum production.  The patient denies dizziness.  There is no hematuria or hematochezia.  The patient denies any muscle aches or arthritis.  The patient denies any rash.  The patient denies frequent falling or instability.  There is no history of depression or anxiety.  All other systems were reviewed and are negative.   Physical Exam:   Weights 141. Blood pressure is 110/60 sitting, heart rate is 80 and regular. Lungs show some coarseness of breath sounds.  There is a soft systolic outflow murmur.The head is normocephalic and atraumatic.  Pupils are equally round and reactive to light.  Sclerae nonicteric.  Conjunctiva is clear.  Oropharynx is unremarkable.  There's adequate oral airway.  Neck is supple there are no masses.  Thyroid is not enlarged.  There is no lymphadenopathy.  Lungs are clear.  Chest is symmetric.  Heart shows a regular rate and rhythm.  S1 and S2 are normal.    Abdomen is soft normal bowel sounds.  There is no organomegaly.  Genital and rectal deferred.  Extremities are without edema.  Peripheral pulses are adequate.  Neurologically intact.  Full range of motion.  The patient is not depressed.  Skin is warm and dry.  Assessment / Plan:

## 2010-06-08 ENCOUNTER — Other Ambulatory Visit: Payer: Self-pay | Admitting: Oncology

## 2010-06-08 ENCOUNTER — Encounter (HOSPITAL_BASED_OUTPATIENT_CLINIC_OR_DEPARTMENT_OTHER): Payer: Medicare Other | Admitting: Oncology

## 2010-06-08 DIAGNOSIS — D473 Essential (hemorrhagic) thrombocythemia: Secondary | ICD-10-CM

## 2010-06-08 LAB — CBC WITH DIFFERENTIAL/PLATELET
BASO%: 0.7 % (ref 0.0–2.0)
HGB: 12.3 g/dL — ABNORMAL LOW (ref 13.0–17.1)
LYMPH%: 21.7 % (ref 14.0–49.0)
MCHC: 33.9 g/dL (ref 32.0–36.0)
MONO%: 10.3 % (ref 0.0–14.0)
NEUT#: 4.4 10*3/uL (ref 1.5–6.5)
NEUT%: 64.5 % (ref 39.0–75.0)
Platelets: 213 10*3/uL (ref 140–400)
RDW: 14 % (ref 11.0–14.6)
WBC: 6.9 10*3/uL (ref 4.0–10.3)
nRBC: 0 % (ref 0–0)

## 2010-06-13 ENCOUNTER — Encounter: Payer: Self-pay | Admitting: Internal Medicine

## 2010-08-17 ENCOUNTER — Other Ambulatory Visit: Payer: Self-pay | Admitting: Cardiology

## 2010-10-08 ENCOUNTER — Telehealth: Payer: Self-pay | Admitting: Nurse Practitioner

## 2010-10-08 MED ORDER — EZETIMIBE 10 MG PO TABS: 10.0000 mg | ORAL_TABLET | Freq: Every day | ORAL | Status: DC

## 2010-10-08 NOTE — Telephone Encounter (Signed)
Refill done.  

## 2010-10-08 NOTE — Telephone Encounter (Signed)
Pt states he needs refill for Zithia.  [facility] on [address]  Chart in box.  Pt would like a call back to confirm this has been done.

## 2010-11-11 ENCOUNTER — Encounter (HOSPITAL_BASED_OUTPATIENT_CLINIC_OR_DEPARTMENT_OTHER): Payer: Medicare Other | Admitting: Oncology

## 2010-11-11 ENCOUNTER — Other Ambulatory Visit: Payer: Self-pay | Admitting: Oncology

## 2010-11-11 DIAGNOSIS — D473 Essential (hemorrhagic) thrombocythemia: Secondary | ICD-10-CM

## 2010-11-11 LAB — CBC WITH DIFFERENTIAL/PLATELET
BASO%: 0.8 % (ref 0.0–2.0)
Eosinophils Absolute: 0.1 10*3/uL (ref 0.0–0.5)
LYMPH%: 17.8 % (ref 14.0–49.0)
MCHC: 33.3 g/dL (ref 32.0–36.0)
MONO#: 0.6 10*3/uL (ref 0.1–0.9)
NEUT#: 3.9 10*3/uL (ref 1.5–6.5)
Platelets: 233 10*3/uL (ref 140–400)
RBC: 3.79 10*6/uL — ABNORMAL LOW (ref 4.20–5.82)
RDW: 15 % — ABNORMAL HIGH (ref 11.0–14.6)
WBC: 5.7 10*3/uL (ref 4.0–10.3)
lymph#: 1 10*3/uL (ref 0.9–3.3)

## 2010-11-11 LAB — MORPHOLOGY: PLT EST: ADEQUATE

## 2010-11-11 LAB — CHCC SMEAR

## 2010-12-08 ENCOUNTER — Ambulatory Visit (INDEPENDENT_AMBULATORY_CARE_PROVIDER_SITE_OTHER): Payer: Medicare Other | Admitting: Internal Medicine

## 2010-12-08 ENCOUNTER — Encounter: Payer: Self-pay | Admitting: Internal Medicine

## 2010-12-08 ENCOUNTER — Ambulatory Visit (INDEPENDENT_AMBULATORY_CARE_PROVIDER_SITE_OTHER)
Admission: RE | Admit: 2010-12-08 | Discharge: 2010-12-08 | Disposition: A | Discharge status: Home / Self Care | Payer: Medicare Other | Source: Ambulatory Visit | Attending: Internal Medicine | Admitting: Internal Medicine

## 2010-12-08 VITALS — BP 110/64 | HR 83 | Ht 71.5 in | Wt 149.0 lb

## 2010-12-08 DIAGNOSIS — J449 Chronic obstructive pulmonary disease, unspecified: Secondary | ICD-10-CM

## 2010-12-08 DIAGNOSIS — Z23 Encounter for immunization: Secondary | ICD-10-CM

## 2010-12-08 DIAGNOSIS — J4489 Other specified chronic obstructive pulmonary disease: Secondary | ICD-10-CM

## 2010-12-08 NOTE — Patient Instructions (Signed)
Order- CXR  Dx COPD  Flu vax

## 2010-12-08 NOTE — Progress Notes (Signed)
Patient ID: Darrell Burnett, male    DOB: 1929-03-31, 77 y.o.   MRN: [id number]  HPI 06/03/10-77 yoM former smoker  followed for COPD complicated by hx of CHF- Dr Deborah Chalk.  Last here April 14, 2010 for initial visit. We tried Spiriva but he couldn't tell much difference.  Blames pollen for postnasal drip causing some cough. Denies fever, sore throat. PFT 05/20/10 reviewed with him- severe obstructive disease, FEV1 1.33/ 48%, FEV1/FVC 0.45, slight response to bronchodilator, RV 147, DLCO 0.72.   12/08/10- 06/03/10-77 yoM former smoker  followed for COPD complicated by hx of CHF- Dr Deborah Chalk.  He blames "allergy" for postnasal drainage and some cough. Using Claritin only occasionally. Chest has not changed with the season and weather changes. It is not routine for him to cough. He denies chest pain or palpitation. Never feels tight or wheeze and is not using a rescue inhaler. Has not felt he needed Advair and he didn't like reading about side effects. He had a difficult cataract surgical procedure and doesn't want anything that might bother his eyes. He is being followed by Dr.Magrinat for thrombocytosis with CBC checks.  CXR-02/22/10- COPD with no acute process.    Review of Systems See HPI Constitutional:   No-   weight loss, night sweats, fevers, chills, fatigue, lassitude. HEENT:   No-  headaches, difficulty swallowing, tooth/dental problems, sore throat,       No-  sneezing, itching, ear ache, nasal congestion,+ post nasal drip,  CV:  No-   chest pain, orthopnea, PND, swelling in lower extremities, anasarca, dizziness, palpitations Resp: No-  acute shortness of breath with exertion or at rest.              No-   productive cough,  No non-productive cough,  No- coughing up of blood.              No-   change in color of mucus.  No- wheezing.   Skin: No-   rash or lesions. GI:  No-   heartburn, indigestion, abdominal pain, nausea, vomiting, diarrhea,                 change in bowel habits,  loss of appetite GU: No-   dysuria, change in color of urine, no urgency or frequency.  No- flank pain. MS:  No-   joint pain or swelling.  No- decreased range of motion.  No- back pain. Neuro-     nothing unusual Psych:  No- change in mood or affect. No depression or anxiety.  No memory loss.       Objective:   Physical Exam General- Alert, Oriented, Affect-appropriate, Distress- none acute, trim Skin- rash-none, lesions- none, excoriation- none Lymphadenopathy- none Head- atraumatic            Eyes- Gross vision intact, PERRLA, conjunctivae clear secretions            Ears- Hearing, canals-normal            Nose- Clear, no-Septal dev, mucus, polyps, erosion, perforation             Throat- Mallampati II , mucosa clear , drainage- none, tonsils- atrophic Neck- flexible , trachea midline, no stridor , thyroid nl, carotid no bruit Chest - symmetrical excursion , unlabored           Heart/CV- RRR , no murmur , no gallop  , no rub, nl s1 s2                           -  JVD- none , edema- none, stasis changes- none, varices- none           Lung- clear to P&A, wheeze- none, cough- none , dullness-none, rub- none           Chest wall-  Abd- tender-no, distended-no, bowel sounds-present, HSM- no Br/ Gen/ Rectal- Not done, not indicated Extrem- cyanosis- none, clubbing, none, atrophy- none, strength- nl Neuro- grossly intact to observation

## 2010-12-10 NOTE — Assessment & Plan Note (Signed)
Well controlled currently. I don't think he is very active and I suspect he underestimates his exercise limitation. Plan-chest x-ray, flu vaccine

## 2010-12-15 NOTE — Progress Notes (Signed)
Quick Note:  Pt aware of results. ______ 

## 2010-12-15 NOTE — Progress Notes (Signed)
Quick Note:  LMTCB ______ 

## 2010-12-17 ENCOUNTER — Other Ambulatory Visit: Payer: Self-pay | Admitting: Cardiology

## 2010-12-17 NOTE — Telephone Encounter (Signed)
Refilled lotensin  Adv. Needs appt.

## 2011-01-21 ENCOUNTER — Telehealth: Payer: Self-pay | Admitting: Oncology

## 2011-01-21 NOTE — Telephone Encounter (Signed)
Pt came by and scheduled appts for Feb-Oct2013

## 2011-02-07 ENCOUNTER — Other Ambulatory Visit: Payer: Self-pay | Admitting: Cardiology

## 2011-03-11 ENCOUNTER — Encounter: Payer: Self-pay | Admitting: Cardiology

## 2011-03-11 ENCOUNTER — Ambulatory Visit (INDEPENDENT_AMBULATORY_CARE_PROVIDER_SITE_OTHER): Payer: Medicare Other | Admitting: Cardiology

## 2011-03-11 DIAGNOSIS — I509 Heart failure, unspecified: Secondary | ICD-10-CM

## 2011-03-11 DIAGNOSIS — I428 Other cardiomyopathies: Secondary | ICD-10-CM

## 2011-03-11 DIAGNOSIS — E785 Hyperlipidemia, unspecified: Secondary | ICD-10-CM

## 2011-03-11 LAB — BASIC METABOLIC PANEL
CO2: 30 mEq/L (ref 19–32)
Chloride: 100 mEq/L (ref 96–112)
Potassium: 4.2 mEq/L (ref 3.5–5.1)

## 2011-03-11 LAB — HEPATIC FUNCTION PANEL
Albumin: 3.6 g/dL (ref 3.5–5.2)
Bilirubin, Direct: 0 mg/dL (ref 0.0–0.3)
Total Protein: 6.8 g/dL (ref 6.0–8.3)

## 2011-03-11 LAB — LIPID PANEL
Cholesterol: 168 mg/dL (ref 0–200)
LDL Cholesterol: 98 mg/dL (ref 0–99)
Total CHOL/HDL Ratio: 3
Triglycerides: 87 mg/dL (ref 0.0–149.0)
VLDL: 17.4 mg/dL (ref 0.0–40.0)

## 2011-03-11 NOTE — Assessment & Plan Note (Signed)
Blood pressure controlled. Continue present medications. Check potassium and renal function. 

## 2011-03-11 NOTE — Assessment & Plan Note (Signed)
Continue present medications. Check lipids and liver. 

## 2011-03-11 NOTE — Patient Instructions (Signed)
Your physician wants you to follow-up in: 1 year.   You will receive a reminder letter in the mail two months in advance. If you don't receive a letter, please call our office to schedule the follow-up appointment.   LAB TESTS TODAY:  BMET, LIPIDS/LIVER

## 2011-03-11 NOTE — Assessment & Plan Note (Signed)
Euvolemic on examination. Continue present dose of diuretic. 

## 2011-03-11 NOTE — Progress Notes (Signed)
HPI: 78 year old male previously followed by Dr.Tennant for fu of cardiomyopathy. Cardiac catheterization in March of 2000 showed a 50% PDA but otherwise no obstructive coronary disease. The patient's ejection fraction was 30%. Felt possibly related to ETOH.  Nuclear study in October of 2008 showed an ejection fraction of 54% with probable normal perfusion. Echocardiogram in September of 2011 showed asymmetric septal hypertrophy, lower limit of normal LV function, grade 1 diastolic dysfunction, mild left atrial enlargement, mitral valve prolapse with mild mitral regurgitation. Holter monitor in September of 2007 showed PVCs and couplets. Since he was last seen in May of 2012,   Current Outpatient Prescriptions  Medication Sig Dispense Refill  . aspirin 81 MG tablet Take 160 mg by mouth daily.      . carvedilol (COREG) 12.5 MG tablet Take 12.5 mg by mouth 2 (two) times daily with a meal.        . ezetimibe (ZETIA) 10 MG tablet Take 1 tablet (10 mg total) by mouth daily.  30 tablet  5  . furosemide (LASIX) 40 MG tablet TAKE 1 TABLET BY MOUTH EVERY DAY  30 tablet  4  . glipiZIDE (GLUCOTROL) 10 MG tablet Take 10 mg by mouth 2 (two) times daily before a meal.        . hydroxyurea (HYDREA) 500 MG capsule Take 500 mg by mouth daily. May take with food to minimize GI side effects. AS DIRECTED       . LOTENSIN 10 MG tablet TAKE 1 TABLET EVERY DAY  30 tablet  11  . metFORMIN (GLUCOPHAGE) 1000 MG tablet Take 1,000 mg by mouth 2 (two) times daily with a meal.        . Multiple Vitamin (MULTIVITAMIN) tablet Take 1 tablet by mouth daily.        . Multiple Vitamins-Minerals (OCUVITE PO) Take 1 tablet by mouth daily.      Marland Kitchen DISCONTD: LOTENSIN 10 MG tablet TAKE 1 TABLET BY MOUTH EVERY DAY  30 tablet  1     Past Medical History  Diagnosis Date  . Hyperlipidemia   . Diabetes mellitus   . Cardiomyopathy     improved  . Thrombocytosis   . Heart failure     Past Surgical History  Procedure Date  .  Orchioplasty 1955  . Tonsillectomy   . Cardiac catheterization 2000    SHOWED MINIMAL CORONARY ATHERSCLEROSIS WITH AN ESTIMATED GLOBAL  EJECTION FRACTION AT THE TIME OF 30%    History   Social History  . Marital Status: Married    Spouse Name: N/A    Number of Children: N/A  . Years of Education: N/A   Occupational History  . retired Charity fundraiser x 45 years, Astronomer    Social History Main Topics  . Smoking status: Former Smoker -- 1.0 packs/day for 45 years    Types: Cigarettes    Quit date: 02/01/2000  . Smokeless tobacco: Never Used  . Alcohol Use: 2.4 oz/week    4 Glasses of wine per week  . Drug Use: No  . Sexually Active: Not on file   Other Topics Concern  . Not on file   Social History Narrative  . No narrative on file    ROS: no fevers or chills, productive cough, hemoptysis, dysphasia, odynophagia, melena, hematochezia, dysuria, hematuria, rash, seizure activity, orthopnea, PND, pedal edema, claudication. Remaining systems are negative.  Physical Exam: Well-developed well-nourished in no acute distress.  Skin is warm and dry.  HEENT is normal.  Neck is supple. No thyromegaly.  Chest is clear to auscultation with normal expansion.  Cardiovascular exam is regular rate and rhythm.  Abdominal exam nontender or distended. No masses palpated. Extremities show trace edema. neuro grossly intact  ECG sinus rhythm at a rate of 95. Occasional PVCs. Right bundle branch block.

## 2011-03-11 NOTE — Assessment & Plan Note (Signed)
LV function improved on most recent echocardiogram. Continue beta blocker and ACE inhibitor. 

## 2011-03-15 ENCOUNTER — Other Ambulatory Visit: Payer: Self-pay | Admitting: *Deleted

## 2011-03-15 MED ORDER — CARVEDILOL 12.5 MG PO TABS: 12.5000 mg | ORAL_TABLET | Freq: Two times a day (BID) | ORAL | Status: DC

## 2011-03-16 ENCOUNTER — Encounter: Payer: Self-pay | Admitting: *Deleted

## 2011-03-17 ENCOUNTER — Other Ambulatory Visit (HOSPITAL_BASED_OUTPATIENT_CLINIC_OR_DEPARTMENT_OTHER): Payer: Medicare Other | Admitting: Lab

## 2011-03-17 DIAGNOSIS — D473 Essential (hemorrhagic) thrombocythemia: Secondary | ICD-10-CM

## 2011-03-17 LAB — CBC WITH DIFFERENTIAL/PLATELET
Basophils Absolute: 0 10*3/uL (ref 0.0–0.1)
Eosinophils Absolute: 0.1 10*3/uL (ref 0.0–0.5)
HCT: 38.9 % (ref 38.4–49.9)
LYMPH%: 20.9 % (ref 14.0–49.0)
MCV: 94.7 fL (ref 79.3–98.0)
MONO#: 0.9 10*3/uL (ref 0.1–0.9)
NEUT#: 4.2 10*3/uL (ref 1.5–6.5)
NEUT%: 63.4 % (ref 39.0–75.0)
Platelets: 255 10*3/uL (ref 140–400)
WBC: 6.6 10*3/uL (ref 4.0–10.3)

## 2011-03-17 LAB — CHCC SMEAR

## 2011-03-17 LAB — MORPHOLOGY

## 2011-04-08 ENCOUNTER — Other Ambulatory Visit: Payer: Self-pay | Admitting: *Deleted

## 2011-04-08 MED ORDER — LOTENSIN 10 MG PO TABS: 10.0000 mg | ORAL_TABLET | Freq: Every day | ORAL | Status: DC

## 2011-07-09 ENCOUNTER — Other Ambulatory Visit: Payer: Self-pay | Admitting: Cardiology

## 2011-07-14 ENCOUNTER — Other Ambulatory Visit (HOSPITAL_BASED_OUTPATIENT_CLINIC_OR_DEPARTMENT_OTHER): Payer: Medicare Other | Admitting: Lab

## 2011-07-14 DIAGNOSIS — D473 Essential (hemorrhagic) thrombocythemia: Secondary | ICD-10-CM

## 2011-07-14 LAB — CBC WITH DIFFERENTIAL/PLATELET
Basophils Absolute: 0 10*3/uL (ref 0.0–0.1)
Eosinophils Absolute: 0.1 10*3/uL (ref 0.0–0.5)
HGB: 12.4 g/dL — ABNORMAL LOW (ref 13.0–17.1)
MCV: 96.5 fL (ref 79.3–98.0)
MONO#: 0.8 10*3/uL (ref 0.1–0.9)
MONO%: 12 % (ref 0.0–14.0)
NEUT#: 4.4 10*3/uL (ref 1.5–6.5)
Platelets: 221 10*3/uL (ref 140–400)
RBC: 4.03 10*6/uL — ABNORMAL LOW (ref 4.20–5.82)
RDW: 14.9 % — ABNORMAL HIGH (ref 11.0–14.6)
WBC: 6.3 10*3/uL (ref 4.0–10.3)

## 2011-07-14 LAB — MORPHOLOGY: PLT EST: ADEQUATE

## 2011-07-14 LAB — CHCC SMEAR

## 2011-10-31 ENCOUNTER — Other Ambulatory Visit: Payer: Self-pay | Admitting: Cardiology

## 2011-11-10 ENCOUNTER — Ambulatory Visit: Payer: Medicare Other | Admitting: Oncology

## 2011-11-10 ENCOUNTER — Other Ambulatory Visit: Payer: Self-pay | Admitting: *Deleted

## 2011-11-10 ENCOUNTER — Other Ambulatory Visit: Payer: Medicare Other | Admitting: Lab

## 2011-11-10 DIAGNOSIS — D473 Essential (hemorrhagic) thrombocythemia: Secondary | ICD-10-CM

## 2011-11-11 ENCOUNTER — Other Ambulatory Visit: Payer: Self-pay | Admitting: Cardiology

## 2011-12-01 ENCOUNTER — Ambulatory Visit (INDEPENDENT_AMBULATORY_CARE_PROVIDER_SITE_OTHER): Payer: Medicare Other

## 2011-12-01 DIAGNOSIS — Z23 Encounter for immunization: Secondary | ICD-10-CM

## 2011-12-03 ENCOUNTER — Other Ambulatory Visit: Payer: Self-pay | Admitting: Cardiology

## 2011-12-08 ENCOUNTER — Ambulatory Visit (INDEPENDENT_AMBULATORY_CARE_PROVIDER_SITE_OTHER): Payer: Medicare Other | Admitting: Internal Medicine

## 2011-12-08 ENCOUNTER — Encounter: Payer: Self-pay | Admitting: Internal Medicine

## 2011-12-08 ENCOUNTER — Ambulatory Visit (INDEPENDENT_AMBULATORY_CARE_PROVIDER_SITE_OTHER)
Admission: RE | Admit: 2011-12-08 | Discharge: 2011-12-08 | Disposition: A | Discharge status: Home / Self Care | Payer: Medicare Other | Source: Ambulatory Visit | Attending: Internal Medicine | Admitting: Internal Medicine

## 2011-12-08 VITALS — BP 116/62 | HR 70 | Ht 71.5 in | Wt 138.2 lb

## 2011-12-08 DIAGNOSIS — J449 Chronic obstructive pulmonary disease, unspecified: Secondary | ICD-10-CM

## 2011-12-08 NOTE — Progress Notes (Signed)
Patient ID: Darrell Burnett, male    DOB: 1929/06/04, 78 y.o.   MRN: [id number]  HPI 06/03/10-80 yoM former smoker  followed for COPD complicated by hx of CHF- Dr Deborah Chalk.  Last here April 14, 2010 for initial visit. We tried Spiriva but he couldn't tell much difference.  Blames pollen for postnasal drip causing some cough. Denies fever, sore throat. PFT 05/20/10 reviewed with him- severe obstructive disease, FEV1 1.33/ 48%, FEV1/FVC 0.45, slight response to bronchodilator, RV 147, DLCO 0.72.   12/08/10- 06/03/10-80 yoM former smoker  followed for COPD complicated by hx of CHF- Dr Deborah Chalk.  He blames "allergy" for postnasal drainage and some cough. Using Claritin only occasionally. Chest has not changed with the season and weather changes. It is not routine for him to cough. He denies chest pain or palpitation. Never feels tight or wheeze and is not using a rescue inhaler. Has not felt he needed Advair and he didn't like reading about side effects. He had a difficult cataract surgical procedure and doesn't want anything that might bother his eyes. He is being followed by Dr.Magrinat for thrombocytosis with CBC checks.  CXR-02/22/10- COPD with no acute process.  12/08/11-82 yoM former smoker  followed for COPD complicated by hx of CHF- Dr Jens Som SOB with [facility](going [place] [facility]) COPD Assessment Test( CAT) Score 4/40       Had flu vax No major events. Some DOE on hills and stairs. Not limiting to his life style. No cough/ wheeze. Seasonal postnasal drip. Stopped Advair- not needed. Didn't like Spiriva. PFT 05/20/10- moderate to severe obstructive disease with response to bronchodilator. FEV1 1.33/ 48%, FEV1/FVC 0.45, RV 147%, DLCO 72% CXR 11/  /12 IMPRESSION:  1. No acute findings.  2. Changes of COPD identified.  Original Report Authenticated By: Rosealee Albee, M.D.   Review of Systems-See HPI Constitutional:   No-   weight loss, night sweats, fevers, chills, fatigue, lassitude. HEENT:    No-  headaches, difficulty swallowing, tooth/dental problems, sore throat,       No-  sneezing, itching, ear ache, nasal congestion,+ post nasal drip,  CV:  No-   chest pain, orthopnea, PND, swelling in lower extremities, anasarca, dizziness, palpitations Resp: No-  acute shortness of breath with exertion or at rest.              No-   productive cough,  No non-productive cough,  No- coughing up of blood.              No-   change in color of mucus.  No- wheezing.   Skin: No-   rash or lesions. GI:  No-   heartburn, indigestion, abdominal pain, nausea, vomiting,  GU: . MS:  No-   joint pain or swelling.   Neuro-     nothing unusual Psych:  No- change in mood or affect. No depression or anxiety.  No memory loss.    Objective:   Physical Exam General- Alert, Oriented, Affect-appropriate, Distress- none acute, trim Skin- rash-none, lesions- none, excoriation- none Lymphadenopathy- none Head- atraumatic            Eyes- Gross vision intact, PERRLA, conjunctivae clear secretions            Ears- Hearing, canals-normal            Nose- Clear, no-Septal dev, mucus, polyps, erosion, perforation             Throat- Mallampati II , mucosa clear , drainage- none,  tonsils- atrophic Neck- flexible , trachea midline, no stridor , thyroid nl, carotid no bruit Chest - symmetrical excursion , unlabored           Heart/CV- RRR , no murmur , no gallop  , no rub, nl s1 s2                           - JVD- none , edema- none, stasis changes- none, varices- none           Lung- +few crackles, +diminished, wheeze- none, cough- none , dullness-none, rub- none           Chest wall-  Abd-  Br/ Gen/ Rectal- Not done, not indicated Extrem- cyanosis- none, clubbing, none, atrophy- none, strength- nl Neuro- grossly intact to observation

## 2011-12-08 NOTE — Patient Instructions (Addendum)
Order- CXR    Dx COPD  Please call as needed 

## 2011-12-15 NOTE — Progress Notes (Signed)
Quick Note:  LMTCB ______ 

## 2011-12-16 ENCOUNTER — Telehealth: Payer: Self-pay | Admitting: Internal Medicine

## 2011-12-16 NOTE — Assessment & Plan Note (Addendum)
Small reversible component, but he recognized little benefit from bronchodilators- not cost effective. We may try again with a long acting product like Breo Ellipta in the future.  Plan- remain active. CXR

## 2011-12-16 NOTE — Telephone Encounter (Signed)
Notes Recorded by Waymon Budge, MD on 12/08/2011 at 4:29 PM CXR- nothing new or active.   I spoke with patient about results and he verbalized understanding and had no questions

## 2011-12-19 ENCOUNTER — Telehealth: Payer: Self-pay | Admitting: Oncology

## 2011-12-19 ENCOUNTER — Ambulatory Visit (HOSPITAL_BASED_OUTPATIENT_CLINIC_OR_DEPARTMENT_OTHER): Payer: Medicare Other | Admitting: Oncology

## 2011-12-19 ENCOUNTER — Other Ambulatory Visit (HOSPITAL_BASED_OUTPATIENT_CLINIC_OR_DEPARTMENT_OTHER): Payer: Medicare Other | Admitting: Lab

## 2011-12-19 VITALS — BP 116/68 | HR 91 | Temp 97.5°F | Resp 18 | Ht 71.5 in | Wt 136.8 lb

## 2011-12-19 DIAGNOSIS — D473 Essential (hemorrhagic) thrombocythemia: Secondary | ICD-10-CM

## 2011-12-19 LAB — CBC WITH DIFFERENTIAL/PLATELET
BASO%: 0.8 % (ref 0.0–2.0)
EOS%: 1.9 % (ref 0.0–7.0)
MCH: 31 pg (ref 27.2–33.4)
MCHC: 33 g/dL (ref 32.0–36.0)
NEUT%: 73.6 % (ref 39.0–75.0)
RBC: 3.73 10*6/uL — ABNORMAL LOW (ref 4.20–5.82)
RDW: 15.1 % — ABNORMAL HIGH (ref 11.0–14.6)
WBC: 7.2 10*3/uL (ref 4.0–10.3)
lymph#: 0.9 10*3/uL (ref 0.9–3.3)

## 2011-12-19 LAB — MORPHOLOGY

## 2011-12-19 MED ORDER — HYDROXYUREA 500 MG PO CAPS: 500.0000 mg | ORAL_CAPSULE | Freq: Every day | ORAL | Status: DC

## 2011-12-19 NOTE — Telephone Encounter (Signed)
gve the pt his feb,may,aug and npv 2014 appt calendars

## 2011-12-19 NOTE — Progress Notes (Signed)
ID: Darrell Burnett   DOB: 1929/04/28  MR#: [id number]  WUJ#:[id number]  PCP: Burtis Junes, MD GYN:  SU:  OTHER MD:   INTERVAL HISTORY: Mr. Chalk returns for routine followup of his thrombocytosis. The interval history is unremarkable. He continues to take his Hydrea and aspirin with no side effects that he is aware of, and in particular no bleeding or clotting problems.  REVIEW OF SYSTEMS: He short of breath particularly when climbing stairs. Occasionally his feet swell. He describes himself is moderately fatigued. Currently he has significant pain in his tailbone from a football game he went to in [place] where he sat on cold steel bandages for 3 hours. (Unfortunately [employer] lost). A detailed review of systems was otherwise noncontributory  PAST MEDICAL HISTORY: Past Medical History  Diagnosis Date  . Hyperlipidemia   . Diabetes mellitus   . Cardiomyopathy     improved  . Thrombocytosis   . Heart failure     PAST SURGICAL HISTORY: Past Surgical History  Procedure Date  . Orchioplasty 1955  . Tonsillectomy   . Cardiac catheterization 2000    SHOWED MINIMAL CORONARY ATHERSCLEROSIS WITH AN ESTIMATED GLOBAL  EJECTION FRACTION AT THE TIME OF 30%    FAMILY HISTORY Family History  Problem Relation Age of Onset  . Stomach cancer Father   . Asthma Son   Step-fatther died from lung cancer (smoker), age 46. Biological father died from cancer of the stomach, age 70. Mother died 24 y/o. No brothers or sisters. 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 Married 50+ to [place] (disabled due to LBP/arthritis/ chronic bursitis). Four children, older son died from suicide, 2d son died from burns; youngest son Charly Holcomb lives in [place], PTS from [employer]; Myra Rude in [place] works for [employer]. One grad-daughter and 2 "greats."  ADVANCED DIRECTIVES:  HEALTH MAINTENANCE: History    Substance Use Topics  . Smoking status: Former Smoker -- 1.0 packs/day for 45 years    Types: Cigarettes    Quit date: 02/01/2000  . Smokeless tobacco: Never Used  . Alcohol Use: 2.4 oz/week    4 Glasses of wine per week     Colonoscopy:  PAP:  Bone density:  Lipid panel:  No Known Allergies  Current Outpatient Prescriptions  Medication Sig Dispense Refill  . aspirin 81 MG tablet Take 160 mg by mouth daily.      . carvedilol (COREG) 12.5 MG tablet TAKE 1 TABLET (12.5 MG TOTAL) BY MOUTH 2 (TWO) TIMES DAILY WITH A MEAL.  60 tablet  5  . ezetimibe (ZETIA) 10 MG tablet Take 1 tablet (10 mg total) by mouth daily.  30 tablet  5  . furosemide (LASIX) 40 MG tablet TAKE 1 TABLET DAILY  30 tablet  4  . glipiZIDE (GLUCOTROL) 10 MG tablet Take 10 mg by mouth 2 (two) times daily before a meal.        . hydroxyurea (HYDREA) 500 MG capsule Take 500 mg by mouth daily. May take with food to minimize GI side effects. AS DIRECTED       . LOTENSIN 10 MG tablet Take 1 tablet (10 mg total) by mouth daily.  30 tablet  11  . Multiple Vitamin (MULTIVITAMIN) tablet Take 1 tablet by mouth daily.        . Multiple Vitamins-Minerals (OCUVITE PO) Take 1 tablet by mouth daily.        OBJECTIVE: Elderly  African American male who appears frail Filed Vitals:   12/19/11 1159  BP: 116/68  Pulse: 91  Temp: 97.5 F (36.4 C)  Resp: 18     Body mass index is 18.81 kg/(m^2).    ECOG FS: 1  Sclerae unicteric Oropharynx clear No cervical or supraclavicular adenopathy Lungs no rales or rhonchi Heart regular rate and rhythm Abd benign, no palpable splenomegaly MSK no focal spinal tenderness, no peripheral edema Neuro: nonfocal   LAB RESULTS: Lab Results  Component Value Date   WBC 7.2 12/19/2011   NEUTROABS 5.3 12/19/2011   HGB 11.6* 12/19/2011   HCT 35.1* 12/19/2011   MCV 94.1 12/19/2011   PLT 208 12/19/2011      Chemistry      Component Value Date/Time   NA 139 03/11/2011 0946   K 4.2 03/11/2011  0946   CL 100 03/11/2011 0946   CO2 30 03/11/2011 0946   BUN 16 03/11/2011 0946   CREATININE 0.8 03/11/2011 0946      Component Value Date/Time   CALCIUM 9.2 03/11/2011 0946   ALKPHOS 49 03/11/2011 0946   AST 18 03/11/2011 0946   ALT 18 03/11/2011 0946   BILITOT 0.7 03/11/2011 0946       No results found for this basename: LABCA2    No components found with this basename: LABCA125    No results found for this basename: INR:1;PROTIME:1 in the last 168 hours  Urinalysis No results found for this basename: colorurine, appearanceur, labspec, phurine, glucoseu, hgbur, bilirubinur, ketonesur, proteinur, urobilinogen, nitrite, leukocytesur    STUDIES: Dg Chest 2 View  12/08/2011  *RADIOLOGY REPORT*  Clinical Data: COPD.  Hypertension.  CHEST - 2 VIEW  Comparison: 12/08/2010  Findings: Heart size is normal.  No pleural effusion or edema.  No airspace consolidation identified.  Review of the visualized osseous structures is unremarkable.  IMPRESSION:  1.  No acute cardiopulmonary abnormalities.   Original Report Authenticated By: Signa Kell, M.D.     ASSESSMENT: 78 y.o. [place] man with a history of essential thrombocytosis initially diagnosed April 2000, on Hydrea since that time, currently at 500 mg a day, as well as 81 mg aspirin daily.    PLAN: He is doing well from my point of view, and the plan will be to continue Hydrea and aspirin indefinitely. We will continue to check his CBC every 3 months. He will see me again in one year. He knows to call for any problems that may develop before that visit.   [place] Ndiaye C    12/19/2011

## 2012-02-02 ENCOUNTER — Other Ambulatory Visit: Payer: Self-pay | Admitting: Family Medicine

## 2012-02-02 ENCOUNTER — Ambulatory Visit
Admission: RE | Admit: 2012-02-02 | Discharge: 2012-02-02 | Disposition: A | Discharge status: Home / Self Care | Payer: Medicare Other | Source: Ambulatory Visit | Attending: Family Medicine | Admitting: Family Medicine

## 2012-02-02 DIAGNOSIS — J189 Pneumonia, unspecified organism: Secondary | ICD-10-CM

## 2012-02-09 ENCOUNTER — Other Ambulatory Visit: Payer: Self-pay | Admitting: *Deleted

## 2012-02-09 MED ORDER — EZETIMIBE 10 MG PO TABS: 10.0000 mg | ORAL_TABLET | Freq: Every day | ORAL | Status: AC

## 2012-02-09 NOTE — Telephone Encounter (Signed)
LEFT MESSAGE FOR PATIENT TO CALL OFFICE TO MAKE AN APPOINTMENT TO BE ABLE TO GET MORE REFILLS. NUMBER PROVIDED.

## 2012-02-17 IMAGING — CR DG CHEST 2V
2 series · 2 of 2 positions shown · non-contrast
Comparison: Chest x-ray of 11/07/2007

CLINICAL DATA: Basilar rales heard on physical exam, hypertension,
diabetes, former smoker

CHEST - 2 VIEW

[w chest pa]
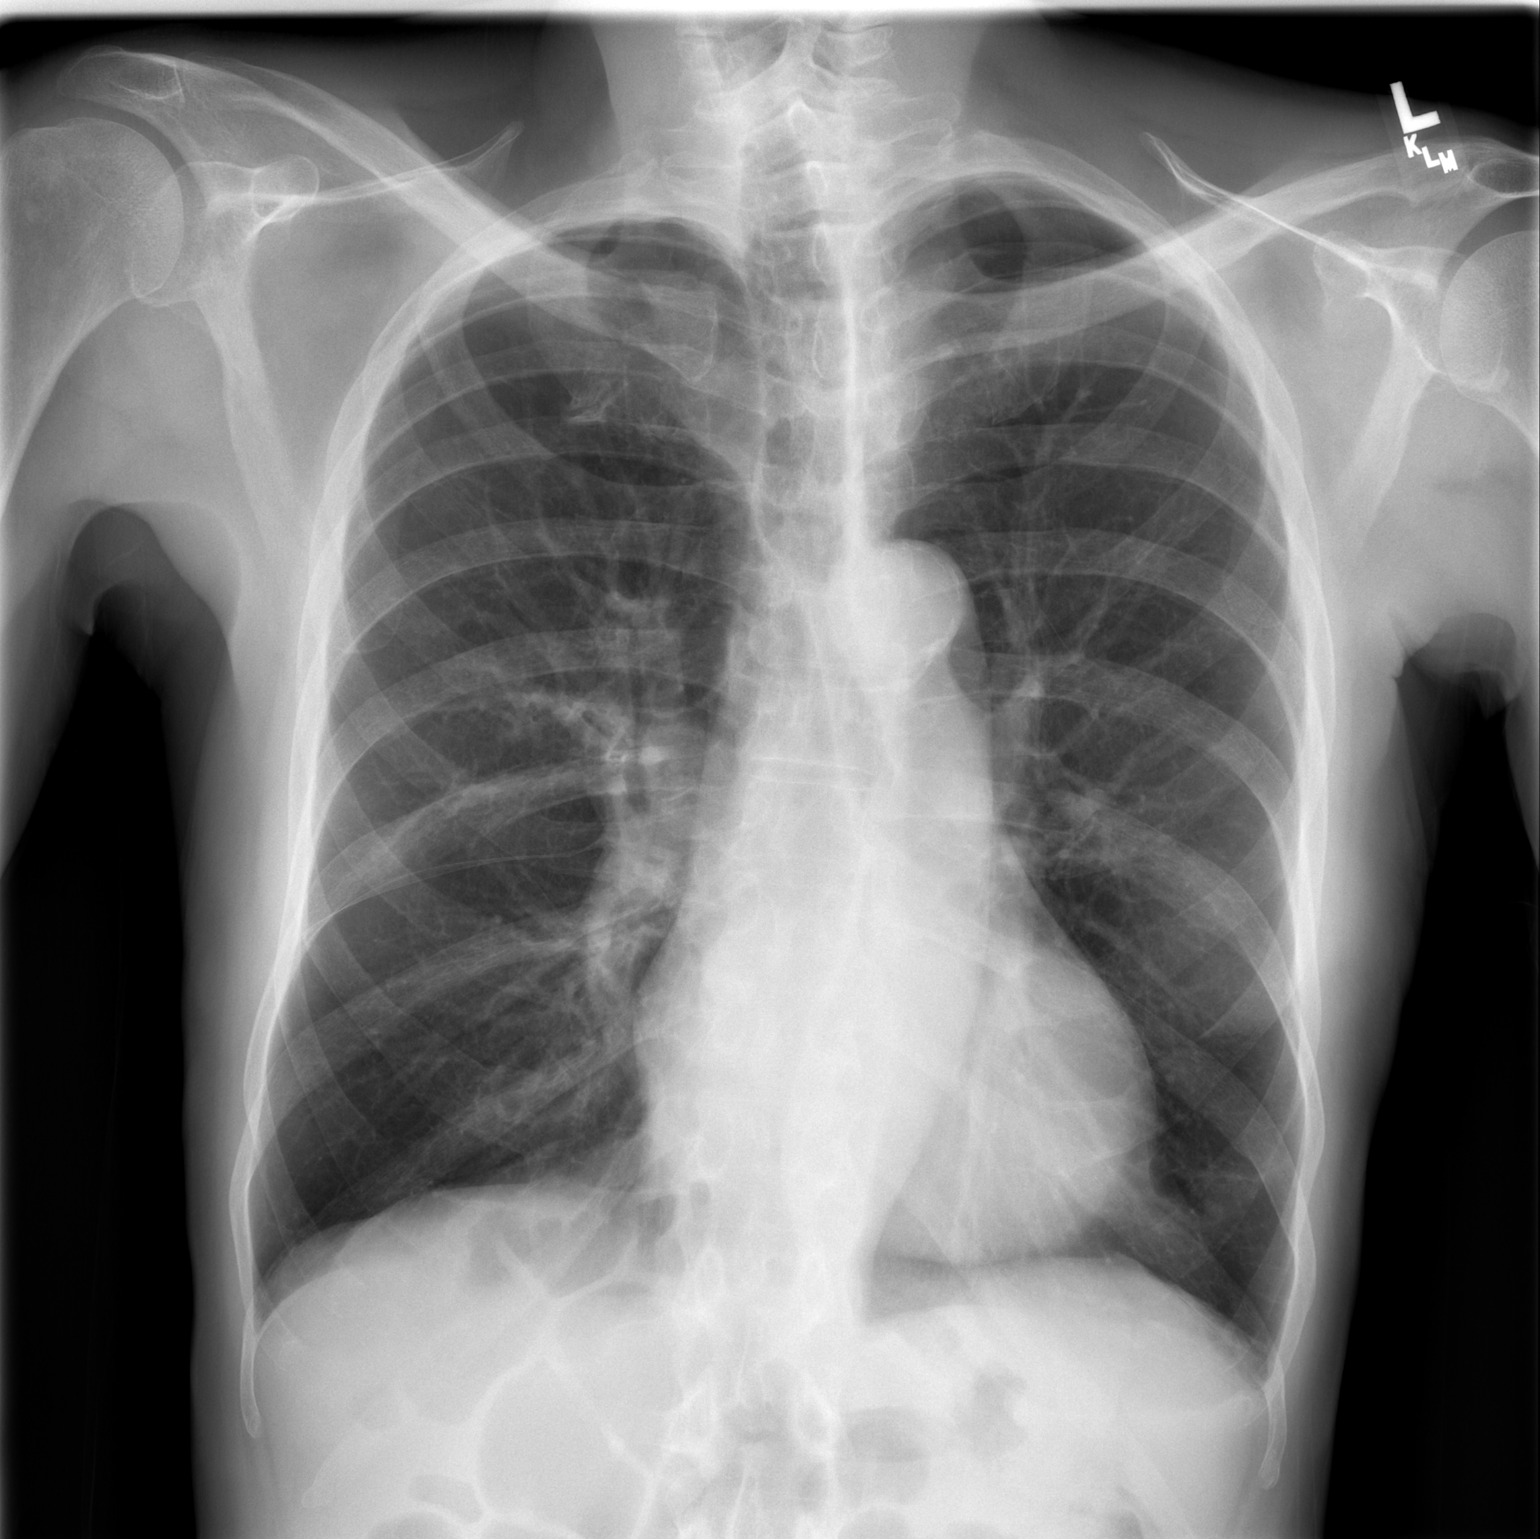

[w chest lat]
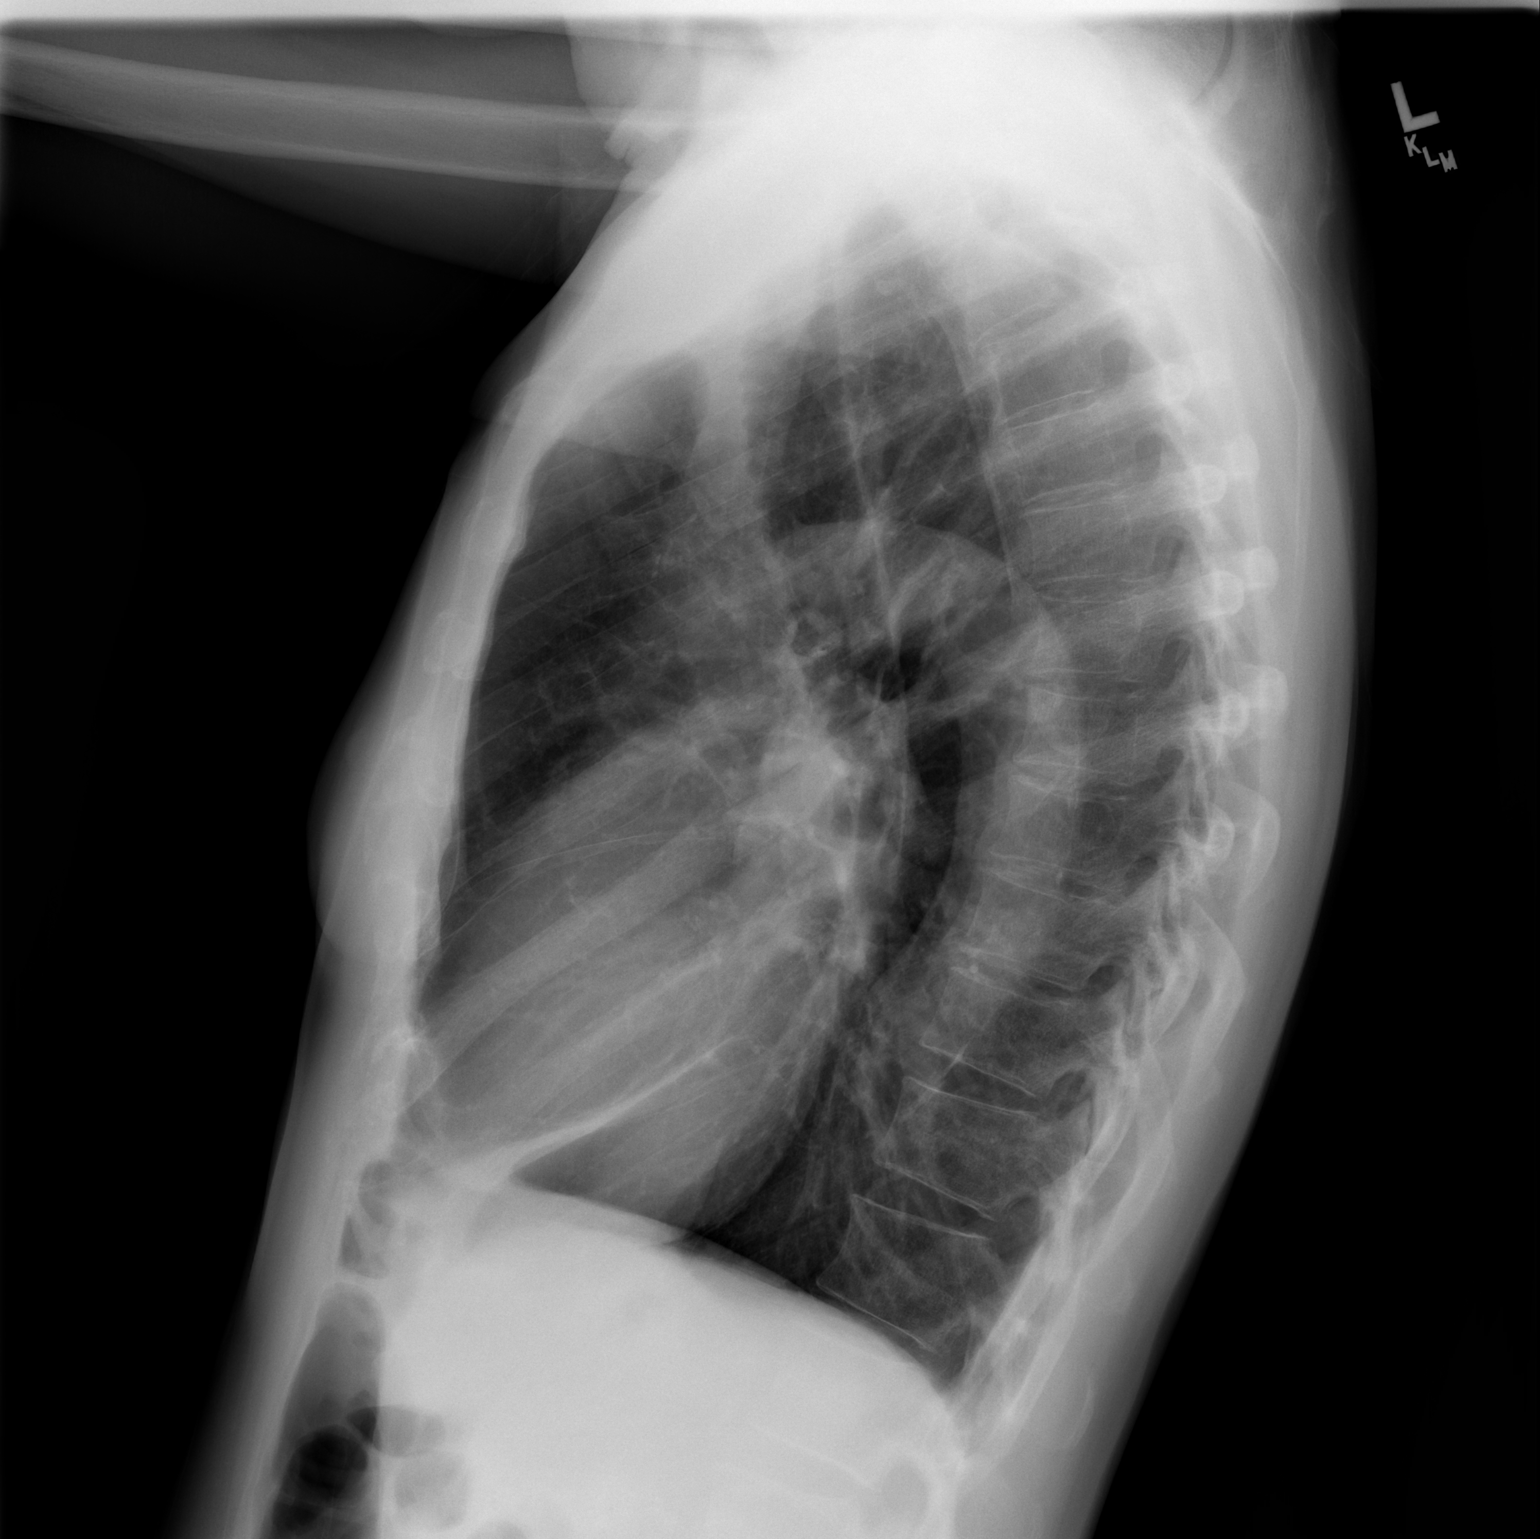

[2 of 2 positions shown; findings below may reference images not displayed]

FINDINGS: The lungs are clear but hyperaerated consistent with
COPD.  There is some peribronchial thickening indicative of chronic
bronchitis.  No active infiltrate or effusion is seen.  Mediastinal
contours are stable.  The heart is within upper limits of normal.
No acute bony abnormality is seen.
IMPRESSION: COPD and probable chronic bronchitis.  No active lung disease.

## 2012-02-22 ENCOUNTER — Telehealth: Payer: Self-pay | Admitting: Internal Medicine

## 2012-02-22 NOTE — Telephone Encounter (Signed)
LMTCB

## 2012-02-22 NOTE — Telephone Encounter (Signed)
Please have patient come in on Friday 02-24-12 at 11:15am. Thanks.

## 2012-02-22 NOTE — Telephone Encounter (Signed)
Pt last ov 12/08/11. Katie pls advise where pt can be worked in with [facility].

## 2012-02-23 NOTE — Telephone Encounter (Signed)
LMTCB x 1,

## 2012-02-23 NOTE — Telephone Encounter (Signed)
Pt aware of appt for 1/24.Darrell Burnett

## 2012-02-24 ENCOUNTER — Encounter: Payer: Self-pay | Admitting: Internal Medicine

## 2012-02-24 ENCOUNTER — Ambulatory Visit (INDEPENDENT_AMBULATORY_CARE_PROVIDER_SITE_OTHER): Payer: Medicare PPO | Admitting: Internal Medicine

## 2012-02-24 VITALS — BP 120/60 | HR 79 | Ht 71.5 in | Wt 138.6 lb

## 2012-02-24 DIAGNOSIS — J449 Chronic obstructive pulmonary disease, unspecified: Secondary | ICD-10-CM

## 2012-02-24 NOTE — Assessment & Plan Note (Signed)
Controlled w/o apparent CHF at this visit.

## 2012-02-24 NOTE — Progress Notes (Signed)
Patient ID: Darrell Burnett, male    DOB: 1930-01-27, 79 y.o.   MRN: [id number]  HPI 06/03/10-80 yoM former smoker  followed for COPD complicated by hx of CHF- Dr Deborah Chalk.  Last here April 14, 2010 for initial visit. We tried Spiriva but he couldn't tell much difference.  Blames pollen for postnasal drip causing some cough. Denies fever, sore throat. PFT 05/20/10 reviewed with him- severe obstructive disease, FEV1 1.33/ 48%, FEV1/FVC 0.45, slight response to bronchodilator, RV 147, DLCO 0.72.   12/08/10- 06/03/10-80 yoM former smoker  followed for COPD complicated by hx of CHF- Dr Deborah Chalk.  He blames "allergy" for postnasal drainage and some cough. Using Claritin only occasionally. Chest has not changed with the season and weather changes. It is not routine for him to cough. He denies chest pain or palpitation. Never feels tight or wheeze and is not using a rescue inhaler. Has not felt he needed Advair and he didn't like reading about side effects. He had a difficult cataract surgical procedure and doesn't want anything that might bother his eyes. He is being followed by Dr.Magrinat for thrombocytosis with CBC checks.  CXR-02/22/10- COPD with no acute process.  12/08/11-82 yoM former smoker  followed for COPD complicated by hx of CHF- Dr Jens Som SOB with [facility]) COPD Assessment Test( CAT) Score 4/40       Had flu vax No major events. Some DOE on hills and stairs. Not limiting to his life style. No cough/ wheeze. Seasonal postnasal drip. Stopped Advair- not needed. Didn't like Spiriva. PFT 05/20/10- moderate to severe obstructive disease with response to bronchodilator. FEV1 1.33/ 48%, FEV1/FVC 0.45, RV 147%, DLCO 72% CXR 11/  /12 IMPRESSION:  1. No acute findings.  2. Changes of COPD identified.  Original Report Authenticated By: Rosealee Albee, M.D.   02/24/11- 48 yoM former smoker  followed for COPD complicated by hx of CHF- Dr Jens Som SOB with [facility]  [facility]) FOLLOWS FOR: OV per Dr Allie Dimmer having recurrent  cough Had distinct chest cold 2-3 weeks ago. Did have flu vax. Treated by Dr Bruna Potter first with Avelox, now levaquin. Still a little cough and phlegm- clear mucus, no fever, no blood..Cough syrup makes him too foggy. Discussed alternatives.  CXR 02/02/12- images reviewed w/ him. There is some linear scarring or atelectasis.  IMPRESSION:  1. No acute cardiopulmonary abnormalities.  2. Chronic bronchitic changes.  Original Report Authenticated By: Signa Kell, M.D.   // would like repeat CXR 6 months to look at RUL and lateral//  Review of Systems-See HPI Constitutional:   No-   weight loss, night sweats, fevers, chills, fatigue, lassitude. HEENT:   No-  headaches, difficulty swallowing, tooth/dental problems, sore throat,       No-  sneezing, itching, ear ache, nasal congestion,+ post nasal drip,  CV:  No-   chest pain, orthopnea, PND, swelling in lower extremities, anasarca, dizziness, palpitations Resp: No-  acute shortness of breath with exertion or at rest.              No-   productive cough,  No non-productive cough,  No- coughing up of blood.              No-   change in color of mucus.  No- wheezing.   Skin: No-   rash or lesions. GI:  No-   heartburn, indigestion, abdominal pain, nausea, vomiting,  GU: . MS:  No-   joint pain or swelling.   Neuro-  nothing unusual Psych:  No- change in mood or affect. No depression or anxiety.  No memory loss.    Objective:   Physical Exam General- Alert, Oriented, Affect-appropriate, Distress- none acute, trim, tall Skin- rash-none, lesions- none, excoriation- none Lymphadenopathy- none Head- atraumatic            Eyes- Gross vision intact, PERRLA, conjunctivae clear secretions            Ears- Hearing, canals-normal            Nose- Clear, no-Septal dev, mucus, polyps, erosion, perforation             Throat- Mallampati II , mucosa clear , drainage- none, tonsils-  atrophic Neck- flexible , trachea midline, no stridor , thyroid nl, carotid no bruit Chest - symmetrical excursion , unlabored           Heart/CV- RRR , no murmur , no gallop  , no rub, nl s1 s2                           - JVD- none , edema- none, stasis changes- none, varices- none           Lung-  +diminished, wheeze- none, cough- none , dullness-none, rub- none           Chest wall-  Abd-  Br/ Gen/ Rectal- Not done, not indicated Extrem- cyanosis- none, clubbing, none, atrophy- none, strength- nl Neuro- grossly intact to observation

## 2012-02-24 NOTE — Assessment & Plan Note (Signed)
Acute exacerbation w/ bronchitis complicated by his DM.  Plan- return for f/u CXR in 6 months, finish levaquin, call as needed.          Discussed gentle cough meds.

## 2012-02-24 NOTE — Patient Instructions (Addendum)
Ok to finish the levaquin antibiotic  Expect that it may take a while before the cough and mucus are back to normal. Please let us know if you see new infection, blood or if you have questions.  If the prescription cough syrup is too strong, try otc Delsym or use the Mucinex cough medicine. You can also make your own cough syrup by making a tea- pouring hot water through a teaspoon of the cooking herb thyme, then adding a tablespoon or two of honey.

## 2012-02-27 ENCOUNTER — Other Ambulatory Visit: Payer: Self-pay

## 2012-02-27 MED ORDER — LOTENSIN 10 MG PO TABS: 10.0000 mg | ORAL_TABLET | Freq: Every day | ORAL | Status: DC

## 2012-02-28 ENCOUNTER — Other Ambulatory Visit: Payer: Self-pay | Admitting: Oncology

## 2012-02-29 ENCOUNTER — Other Ambulatory Visit: Payer: Self-pay | Admitting: *Deleted

## 2012-03-12 ENCOUNTER — Other Ambulatory Visit (HOSPITAL_BASED_OUTPATIENT_CLINIC_OR_DEPARTMENT_OTHER): Payer: Medicare PPO | Admitting: Lab

## 2012-03-12 ENCOUNTER — Ambulatory Visit: Payer: Medicare Other | Admitting: Cardiology

## 2012-03-12 DIAGNOSIS — D473 Essential (hemorrhagic) thrombocythemia: Secondary | ICD-10-CM

## 2012-03-12 LAB — CBC WITH DIFFERENTIAL/PLATELET
Basophils Absolute: 0 10*3/uL (ref 0.0–0.1)
Eosinophils Absolute: 0.2 10*3/uL (ref 0.0–0.5)
HCT: 38.6 % (ref 38.4–49.9)
HGB: 13 g/dL (ref 13.0–17.1)
MONO#: 0.6 10*3/uL (ref 0.1–0.9)
NEUT%: 66.2 % (ref 39.0–75.0)
WBC: 6.1 10*3/uL (ref 4.0–10.3)
lymph#: 1.3 10*3/uL (ref 0.9–3.3)

## 2012-03-13 ENCOUNTER — Encounter: Payer: Self-pay | Admitting: Cardiology

## 2012-03-13 ENCOUNTER — Ambulatory Visit (INDEPENDENT_AMBULATORY_CARE_PROVIDER_SITE_OTHER): Payer: Medicare PPO | Admitting: Cardiology

## 2012-03-13 VITALS — BP 129/79 | HR 92 | Wt 141.0 lb

## 2012-03-13 DIAGNOSIS — E785 Hyperlipidemia, unspecified: Secondary | ICD-10-CM

## 2012-03-13 DIAGNOSIS — I10 Essential (primary) hypertension: Secondary | ICD-10-CM

## 2012-03-13 DIAGNOSIS — I428 Other cardiomyopathies: Secondary | ICD-10-CM

## 2012-03-13 DIAGNOSIS — I429 Cardiomyopathy, unspecified: Secondary | ICD-10-CM

## 2012-03-13 LAB — BASIC METABOLIC PANEL
BUN: 24 mg/dL — ABNORMAL HIGH (ref 6–23)
GFR: 99.15 mL/min (ref >60.00)
Glucose, Bld: 328 mg/dL — ABNORMAL HIGH (ref 70–99)
Potassium: 4.4 mEq/L (ref 3.5–5.1)

## 2012-03-13 NOTE — Patient Instructions (Addendum)
Your physician wants you to follow-up in: ONE YEAR WITH DR CRENSHAW You will receive a reminder letter in the mail two months in advance. If you don't receive a letter, please call our office to schedule the follow-up appointment.   Your physician has requested that you have an echocardiogram. Echocardiography is a painless test that uses sound waves to create images of your heart. It provides your doctor with information about the size and shape of your heart and how well your heart's chambers and valves are working. This procedure takes approximately one hour. There are no restrictions for this procedure.   Your physician recommends that you HAVE LAB WORK TODAY 

## 2012-03-13 NOTE — Assessment & Plan Note (Signed)
Management per primary care. 

## 2012-03-13 NOTE — Progress Notes (Signed)
HPI: Plesant male for fu of cardiomyopathy. Cardiac catheterization in March of 2000 showed a 50% PDA but otherwise no obstructive coronary disease. The patient's ejection fraction was 30%. Felt possibly related to ETOH. Nuclear study in October of 2008 showed an ejection fraction of 54% with probable normal perfusion. Echocardiogram in September of 2011 showed asymmetric septal hypertrophy, lower limit of normal LV function, grade 1 diastolic dysfunction, mild left atrial enlargement, mitral valve prolapse with mild mitral regurgitation. Holter monitor in September of 2007 showed PVCs and couplets. Since he was last seen in May of 2012, the patient has dyspnea with more extreme activities but not with routine activities. It is relieved with rest. It is not associated with chest pain. There is no orthopnea, PND. There is no syncope or palpitations. There is no exertional chest pain. Chronic mild pedal edema improved with diuretics.     Current Outpatient Prescriptions  Medication Sig Dispense Refill  . aspirin 81 MG tablet Take 160 mg by mouth daily.      . carvedilol (COREG) 12.5 MG tablet TAKE 1 TABLET (12.5 MG TOTAL) BY MOUTH 2 (TWO) TIMES DAILY WITH A MEAL.  60 tablet  5  . ezetimibe (ZETIA) 10 MG tablet Take 1 tablet (10 mg total) by mouth daily.  30 tablet  3  . furosemide (LASIX) 40 MG tablet TAKE 1 TABLET DAILY  30 tablet  4  . glipiZIDE (GLUCOTROL) 10 MG tablet Take 10 mg by mouth 2 (two) times daily before a meal.        . hydroxyurea (HYDREA) 500 MG capsule TAKE 1 CAPSULE BY MOUTH DAILY  90 capsule  3  . LOTENSIN 10 MG tablet Take 1 tablet (10 mg total) by mouth daily.  30 tablet  6  . Multiple Vitamin (MULTIVITAMIN) tablet Take 1 tablet by mouth daily.        . Multiple Vitamins-Minerals (OCUVITE PO) Take 1 tablet by mouth daily.       No current facility-administered medications for this visit.     Past Medical History  Diagnosis Date  . Hyperlipidemia   . Diabetes mellitus     . Cardiomyopathy     improved  . Thrombocytosis   . Heart failure     Past Surgical History  Procedure Laterality Date  . Orchioplasty  1955  . Tonsillectomy    . Cardiac catheterization  2000    SHOWED MINIMAL CORONARY ATHERSCLEROSIS WITH AN ESTIMATED GLOBAL  EJECTION FRACTION AT THE TIME OF 30%    History   Social History  . Marital Status: Married    Spouse Name: N/A    Number of Children: N/A  . Years of Education: N/A   Occupational History  . retired Charity fundraiser x 45 years, Astronomer    Social History Main Topics  . Smoking status: Former Smoker -- 1.00 packs/day for 45 years    Types: Cigarettes    Quit date: 02/01/2000  . Smokeless tobacco: Never Used  . Alcohol Use: 2.4 oz/week    4 Glasses of wine per week  . Drug Use: No  . Sexually Active: Not on file   Other Topics Concern  . Not on file   Social History Narrative  . No narrative on file    ROS: no fevers or chills, productive cough, hemoptysis, dysphasia, odynophagia, melena, hematochezia, dysuria, hematuria, rash, seizure activity, orthopnea, PND, claudication. Remaining systems are negative.  Physical Exam: Well-developed well-nourished in no acute distress.  Skin is warm and dry.  HEENT is normal.  Neck is supple.  Chest is clear to auscultation with normal expansion.  Cardiovascular exam is regular rate and rhythm.  Abdominal exam nontender or distended. No masses palpated. Extremities show 1+ edema. neuro grossly intact  ECG sinus rhythm at a rate of 92. First-degree AV block. Right bundle branch block. Occasional PAC. Left posterior fascicular block.

## 2012-03-13 NOTE — Assessment & Plan Note (Signed)
Blood pressure controlled. Continue present medications. Check potassium and renal function. 

## 2012-03-13 NOTE — Assessment & Plan Note (Signed)
Continue ACE inhibitor and beta blocker. Repeat echocardiogram. 

## 2012-03-14 ENCOUNTER — Telehealth: Payer: Self-pay | Admitting: Cardiology

## 2012-03-14 NOTE — Telephone Encounter (Signed)
New Problem:    Patient called in returning your call regarding his recent labs.  Please call back.

## 2012-03-14 NOTE — Telephone Encounter (Signed)
Unable to reach pt or leave a message  

## 2012-03-15 ENCOUNTER — Other Ambulatory Visit (HOSPITAL_COMMUNITY): Payer: Medicare PPO

## 2012-03-20 NOTE — Telephone Encounter (Signed)
Spoke with pt, aware of current labs. He is currently working with dr blunt regarding his blood sugar. Results forwarded to dr blunt.

## 2012-03-22 ENCOUNTER — Ambulatory Visit (HOSPITAL_COMMUNITY): Payer: Medicare PPO | Attending: Cardiology | Admitting: Radiology

## 2012-03-22 DIAGNOSIS — I079 Rheumatic tricuspid valve disease, unspecified: Secondary | ICD-10-CM | POA: Insufficient documentation

## 2012-03-22 DIAGNOSIS — I10 Essential (primary) hypertension: Secondary | ICD-10-CM | POA: Insufficient documentation

## 2012-03-22 DIAGNOSIS — J449 Chronic obstructive pulmonary disease, unspecified: Secondary | ICD-10-CM | POA: Insufficient documentation

## 2012-03-22 DIAGNOSIS — I080 Rheumatic disorders of both mitral and aortic valves: Secondary | ICD-10-CM | POA: Insufficient documentation

## 2012-03-22 DIAGNOSIS — I428 Other cardiomyopathies: Secondary | ICD-10-CM | POA: Insufficient documentation

## 2012-03-22 DIAGNOSIS — I2510 Atherosclerotic heart disease of native coronary artery without angina pectoris: Secondary | ICD-10-CM | POA: Insufficient documentation

## 2012-03-22 DIAGNOSIS — I379 Nonrheumatic pulmonary valve disorder, unspecified: Secondary | ICD-10-CM | POA: Insufficient documentation

## 2012-03-22 DIAGNOSIS — J4489 Other specified chronic obstructive pulmonary disease: Secondary | ICD-10-CM | POA: Insufficient documentation

## 2012-03-22 DIAGNOSIS — D473 Essential (hemorrhagic) thrombocythemia: Secondary | ICD-10-CM | POA: Insufficient documentation

## 2012-03-22 DIAGNOSIS — E119 Type 2 diabetes mellitus without complications: Secondary | ICD-10-CM | POA: Insufficient documentation

## 2012-03-22 DIAGNOSIS — R0989 Other specified symptoms and signs involving the circulatory and respiratory systems: Secondary | ICD-10-CM | POA: Insufficient documentation

## 2012-03-22 DIAGNOSIS — E785 Hyperlipidemia, unspecified: Secondary | ICD-10-CM | POA: Insufficient documentation

## 2012-03-22 DIAGNOSIS — R0609 Other forms of dyspnea: Secondary | ICD-10-CM | POA: Insufficient documentation

## 2012-03-22 NOTE — Progress Notes (Signed)
Echocardiogram performed.  

## 2012-05-01 ENCOUNTER — Other Ambulatory Visit: Payer: Self-pay | Admitting: Cardiology

## 2012-05-12 ENCOUNTER — Other Ambulatory Visit: Payer: Self-pay | Admitting: Cardiology

## 2012-06-11 ENCOUNTER — Other Ambulatory Visit: Payer: Medicare Other | Admitting: Lab

## 2012-07-29 ENCOUNTER — Other Ambulatory Visit: Payer: Self-pay | Admitting: Cardiology

## 2012-08-03 ENCOUNTER — Other Ambulatory Visit: Payer: Self-pay | Admitting: Cardiology

## 2012-08-28 ENCOUNTER — Encounter: Payer: Self-pay | Admitting: Internal Medicine

## 2012-08-28 ENCOUNTER — Ambulatory Visit (INDEPENDENT_AMBULATORY_CARE_PROVIDER_SITE_OTHER): Payer: Medicare PPO | Admitting: Internal Medicine

## 2012-08-28 ENCOUNTER — Ambulatory Visit (INDEPENDENT_AMBULATORY_CARE_PROVIDER_SITE_OTHER)
Admission: RE | Admit: 2012-08-28 | Discharge: 2012-08-28 | Disposition: A | Discharge status: Home / Self Care | Payer: Medicare PPO | Source: Ambulatory Visit | Attending: Internal Medicine | Admitting: Internal Medicine

## 2012-08-28 VITALS — BP 116/72 | HR 98 | Ht 71.5 in | Wt 136.6 lb

## 2012-08-28 DIAGNOSIS — J439 Emphysema, unspecified: Secondary | ICD-10-CM

## 2012-08-28 DIAGNOSIS — J449 Chronic obstructive pulmonary disease, unspecified: Secondary | ICD-10-CM

## 2012-08-28 DIAGNOSIS — J438 Other emphysema: Secondary | ICD-10-CM

## 2012-08-28 NOTE — Patient Instructions (Addendum)
Order- CXR   Dx COPD with emphysema  Please call as needed 

## 2012-08-28 NOTE — Progress Notes (Signed)
Patient ID: Darrell Burnett, male    DOB: [birth year], 79 y.o.   MRN: [id number]  HPI 06/03/10-80 yoM former smoker  followed for COPD complicated by hx of CHF- Dr Deborah Chalk.  Last here April 14, 2010 for initial visit. We tried Spiriva but he couldn't tell much difference.  Blames pollen for postnasal drip causing some cough. Denies fever, sore throat. PFT 05/20/10 reviewed with him- severe obstructive disease, FEV1 1.33/ 48%, FEV1/FVC 0.45, slight response to bronchodilator, RV 147, DLCO 0.72.   12/08/10- 06/03/10-80 yoM former smoker  followed for COPD complicated by hx of CHF- Dr Deborah Chalk.  He blames "allergy" for postnasal drainage and some cough. Using Claritin only occasionally. Chest has not changed with the season and weather changes. It is not routine for him to cough. He denies chest pain or palpitation. Never feels tight or wheeze and is not using a rescue inhaler. Has not felt he needed Advair and he didn't like reading about side effects. He had a difficult cataract surgical procedure and doesn't want anything that might bother his eyes. He is being followed by Dr.Magrinat for thrombocytosis with CBC checks.  CXR-02/22/10- COPD with no acute process.  12/08/11-82 yoM former smoker  followed for COPD complicated by hx of CHF- Dr Jens Som SOB with [facility](going up hill) COPD Assessment Test( CAT) Score 4/40       Had flu vax No major events. Some DOE on hills and stairs. Not limiting to his life style. No cough/ wheeze. Seasonal postnasal drip. Stopped Advair- not needed. Didn't like Spiriva. PFT 05/20/10- moderate to severe obstructive disease with response to bronchodilator. FEV1 1.33/ 48%, FEV1/FVC 0.45, RV 147%, DLCO 72% CXR 11/  /12 IMPRESSION:  1. No acute findings.  2. Changes of COPD identified.  Original Report Authenticated By: Rosealee Albee, M.D.   02/24/11- 65 yoM former smoker  followed for COPD complicated by hx of CHF- Dr Jens Som SOB with [facility]([place]) FOLLOWS FOR: OV per Dr Allie Dimmer having recurrent  cough Had distinct chest cold 2-3 weeks ago. Did have flu vax. Treated by Dr Bruna Potter first with Avelox, now levaquin. Still a little cough and phlegm- clear mucus, no fever, no blood..Cough syrup makes him too foggy. Discussed alternatives.  CXR 02/02/12- images reviewed w/ him. There is some linear scarring or atelectasis.  IMPRESSION:  1. No acute cardiopulmonary abnormalities.  2. Chronic bronchitic changes.  Original Report Authenticated By: Signa Kell, M.D.   // would like repeat CXR 6 months to look at RUL and lateral//  08/28/12-  83 yoM former smoker  followed for COPD complicated by hx of CHF- Dr Jens Som SOB with [facility](going [place]) FOLLOWS FOR: SOB with climbing stairs mostly or when allergies are flared up. He doesn't really see a change over time. No routine cough and no recent infection. Previous trials of standard inhaled meds made little difference- not cost effective.  Denies change in cardiac status. Still following with Dr Bruna Potter as needed for routine care.   Review of Systems-See HPI Constitutional:   No-   weight loss, night sweats, fevers, chills, fatigue, lassitude. HEENT:   No-  headaches, difficulty swallowing, tooth/dental problems, sore throat,       No-  sneezing, itching, ear ache, nasal congestion,+ post nasal drip,  CV:  No-   chest pain, orthopnea, PND, swelling in lower extremities, anasarca, dizziness, palpitations Resp: + shortness of breath with exertion or at rest.  No-   productive cough,  No non-productive cough,  No- coughing up of blood.              No-   change in color of mucus.  No- wheezing.   Skin: No-   rash or lesions. GI:  No-   heartburn, indigestion, abdominal pain, nausea, vomiting,  GU: . MS:  No-   joint pain or swelling.   Neuro-     nothing unusual Psych:  No- change in mood or affect. No depression or anxiety.  No memory loss.    Objective:   Physical  Exam BP 116/72  Pulse 98  Ht 5' 11.5" (1.816 m)  Wt 136 lb 9.6 oz (61.961 kg)  BMI 18.79 kg/m2  SpO2 93% General- Alert, Oriented, Affect-appropriate, Distress- none acute, trim, tall Skin- rash-none, lesions- none, excoriation- none Lymphadenopathy- none Head- atraumatic            Eyes- Gross vision intact, PERRLA, conjunctivae clear secretions            Ears- Hearing, canals-normal            Nose- Clear, no-Septal dev, mucus, polyps, erosion, perforation             Throat- Mallampati II , mucosa clear , drainage- none, tonsils- atrophic Neck- flexible , trachea midline, no stridor , thyroid nl, carotid no bruit Chest - symmetrical excursion , unlabored           Heart/CV- RRR , no murmur , no gallop  , no rub, nl s1 s2                           - JVD- none , edema- none, stasis changes- none, varices- none           Lung-  +diminished, clear, unlabored, wheeze- none, cough- none , dullness-none, rub- none           Chest wall-  Abd-  Br/ Gen/ Rectal- Not done, not indicated Extrem- cyanosis- none, clubbing, none, atrophy- none, strength- nl Neuro- grossly intact to observation

## 2012-08-28 NOTE — Assessment & Plan Note (Signed)
Clinically stable. He maintains activity level. We talked again about meds, but will not re-prescribe at this time. I would like to look again at CXR to exclude evolving RUL density- probably ok.

## 2012-09-07 NOTE — Progress Notes (Signed)
Quick Note:  LMTCB ______ 

## 2012-09-10 NOTE — Progress Notes (Signed)
Quick Note:  Pt aware of results. ______ 

## 2012-09-12 ENCOUNTER — Other Ambulatory Visit: Payer: Self-pay | Admitting: Family Medicine

## 2012-09-12 ENCOUNTER — Ambulatory Visit
Admission: RE | Admit: 2012-09-12 | Discharge: 2012-09-12 | Disposition: A | Discharge status: Home / Self Care | Payer: Medicare PPO | Source: Ambulatory Visit | Attending: Family Medicine | Admitting: Family Medicine

## 2012-09-12 DIAGNOSIS — S6980XA Other specified injuries of unspecified wrist, hand and finger(s), initial encounter: Secondary | ICD-10-CM

## 2012-09-12 DIAGNOSIS — S6990XA Unspecified injury of unspecified wrist, hand and finger(s), initial encounter: Secondary | ICD-10-CM

## 2012-09-13 ENCOUNTER — Other Ambulatory Visit (HOSPITAL_BASED_OUTPATIENT_CLINIC_OR_DEPARTMENT_OTHER): Payer: Medicare PPO

## 2012-09-13 DIAGNOSIS — D473 Essential (hemorrhagic) thrombocythemia: Secondary | ICD-10-CM

## 2012-09-13 LAB — CBC WITH DIFFERENTIAL/PLATELET
Basophils Absolute: 0 10*3/uL (ref 0.0–0.1)
Eosinophils Absolute: 0.2 10*3/uL (ref 0.0–0.5)
HCT: 39.6 % (ref 38.4–49.9)
HGB: 13.1 g/dL (ref 13.0–17.1)
LYMPH%: 19.1 % (ref 14.0–49.0)
MCV: 94.3 fL (ref 79.3–98.0)
MONO%: 13.4 % (ref 0.0–14.0)
NEUT#: 4.4 10*3/uL (ref 1.5–6.5)
NEUT%: 64.4 % (ref 39.0–75.0)
Platelets: 235 10*3/uL (ref 140–400)
RDW: 15.2 % — ABNORMAL HIGH (ref 11.0–14.6)

## 2012-11-01 ENCOUNTER — Other Ambulatory Visit: Payer: Self-pay | Admitting: Cardiology

## 2012-11-01 MED ORDER — BENAZEPRIL HCL 10 MG PO TABS: 10.0000 mg | ORAL_TABLET | Freq: Every day | ORAL | Status: DC

## 2012-11-05 ENCOUNTER — Other Ambulatory Visit: Payer: Self-pay | Admitting: Cardiology

## 2012-11-06 ENCOUNTER — Encounter: Payer: Self-pay | Admitting: *Deleted

## 2012-11-06 NOTE — Telephone Encounter (Signed)
This encounter was created in error - please disregard.

## 2012-11-08 ENCOUNTER — Other Ambulatory Visit: Payer: Self-pay | Admitting: Cardiology

## 2012-12-07 ENCOUNTER — Ambulatory Visit (INDEPENDENT_AMBULATORY_CARE_PROVIDER_SITE_OTHER): Payer: Medicare PPO | Admitting: Internal Medicine

## 2012-12-07 ENCOUNTER — Encounter: Payer: Self-pay | Admitting: Internal Medicine

## 2012-12-07 VITALS — BP 118/70 | HR 101 | Ht 71.5 in | Wt 136.8 lb

## 2012-12-07 DIAGNOSIS — Z23 Encounter for immunization: Secondary | ICD-10-CM

## 2012-12-07 DIAGNOSIS — J438 Other emphysema: Secondary | ICD-10-CM

## 2012-12-07 DIAGNOSIS — J439 Emphysema, unspecified: Secondary | ICD-10-CM

## 2012-12-07 MED ORDER — UMECLIDINIUM-VILANTEROL 62.5-25 MCG/INH IN AEPB: 1.0000 | INHALATION_SPRAY | Freq: Once | RESPIRATORY_TRACT | Status: DC

## 2012-12-07 NOTE — Patient Instructions (Signed)
Flu vax- Hi dose  Sample Anoro inhaler- Try inhaling One puff, One Time Daily    If it is a big help for your breathing, please let us know. Otherwise we will just drop it once you finish the sample.  Please call as needed

## 2012-12-07 NOTE — Progress Notes (Signed)
Patient ID: Darrell Burnett, male    DOB: 11-Sep-1929, 79 y.o.   MRN: [id number]  HPI 06/03/10-80 yoM former smoker  followed for COPD complicated by hx of CHF- Dr Deborah Chalk.  Last here April 14, 2010 for initial visit. We tried Spiriva but he couldn't tell much difference.  Blames pollen for postnasal drip causing some cough. Denies fever, sore throat. PFT 05/20/10 reviewed with him- severe obstructive disease, FEV1 1.33/ 48%, FEV1/FVC 0.45, slight response to bronchodilator, RV 147, DLCO 0.72.   12/08/10- 06/03/10-80 yoM former smoker  followed for COPD complicated by hx of CHF- Dr Deborah Chalk.  He blames "allergy" for postnasal drainage and some cough. Using Claritin only occasionally. Chest has not changed with the season and weather changes. It is not routine for him to cough. He denies chest pain or palpitation. Never feels tight or wheeze and is not using a rescue inhaler. Has not felt he needed Advair and he didn't like reading about side effects. He had a difficult cataract surgical procedure and doesn't want anything that might bother his eyes. He is being followed by Dr.Magrinat for thrombocytosis with CBC checks.  CXR-02/22/10- COPD with no acute process.  12/08/11-82 yoM former smoker  followed for COPD complicated by hx of CHF- Dr Jens Som SOB with [facility](going up [place]) COPD Assessment Test( CAT) Score 4/40       Had flu vax No major events. Some DOE on hills and stairs. Not limiting to his life style. No cough/ wheeze. Seasonal postnasal drip. Stopped Advair- not needed. Didn't like Spiriva. PFT 05/20/10- moderate to severe obstructive disease with response to bronchodilator. FEV1 1.33/ 48%, FEV1/FVC 0.45, RV 147%, DLCO 72% CXR 11/  /12 IMPRESSION:  1. No acute findings.  2. Changes of COPD identified.  Original Report Authenticated By: Rosealee Albee, M.D.   02/24/11- 21 yoM former smoker  followed for COPD complicated by hx of CHF- Dr Jens Som SOB with [facility](going [place]  [place]) FOLLOWS FOR: OV per Dr Allie Dimmer having recurrent  cough Had distinct chest cold 2-3 weeks ago. Did have flu vax. Treated by Dr Bruna Potter first with Avelox, now levaquin. Still a little cough and phlegm- clear mucus, no fever, no blood..Cough syrup makes him too foggy. Discussed alternatives.  CXR 02/02/12- images reviewed w/ him. There is some linear scarring or atelectasis.  IMPRESSION:  1. No acute cardiopulmonary abnormalities.  2. Chronic bronchitic changes.  Original Report Authenticated By: Signa Kell, M.D.   // would like repeat CXR 6 months to look at RUL and lateral//  08/28/12-  83 yoM former smoker  followed for COPD complicated by hx of CHF- Dr Jens Som SOB with [facility](going up [place]) FOLLOWS FOR: SOB with climbing stairs mostly or when allergies are flared up. He doesn't really see a change over time. No routine cough and no recent infection. Previous trials of standard inhaled meds made little difference- not cost effective.  Denies change in cardiac status. Still following with Dr Bruna Potter as needed for routine care.   12/07/12- 43 yoM former smoker  followed for COPD complicated by hx of CHF- Dr Jens Som FOLLOWS FOR:  SOB w/ exertion, same as always. Occasional minor cough only but no wheezing. Stable dyspnea with exertion. Advair never helped. CXR 08/28/12 IMPRESSION:  Generalized hyperinflation consistent with obstructive pulmonary  disease with areas of emphysematous change. No acute superimposed  pulmonary abnormality. Stable chronic findings detailed above.  Original Report Authenticated By: Onalee Hua Call  Review of Systems-See HPI Constitutional:   No-  weight loss, night sweats, fevers, chills, fatigue, lassitude. HEENT:   No-  headaches, difficulty swallowing, tooth/dental problems, sore throat,       No-  sneezing, itching, ear ache, nasal congestion, post nasal drip,  CV:  No-   chest pain, orthopnea, PND, swelling in lower extremities, anasarca, dizziness,  palpitations Resp: + shortness of breath with exertion or at rest.              No-   productive cough,  No non-productive cough,  No- coughing up of blood.              No-   change in color of mucus.  No- wheezing.   Skin: No-   rash or lesions. GI:  No-   heartburn, indigestion, abdominal pain, nausea, vomiting,  GU: . MS:  No-   joint pain or swelling.   Neuro-     nothing unusual Psych:  No- change in mood or affect. No depression or anxiety.  No memory loss.    Objective:   Physical Exam General- Alert, Oriented, Affect-appropriate, Distress- none acute, trim, tall Skin- rash-none, lesions- none, excoriation- none Lymphadenopathy- none Head- atraumatic            Eyes- Gross vision intact, PERRLA, conjunctivae clear secretions            Ears- Hearing, canals-normal            Nose- Clear, no-Septal dev, mucus, polyps, erosion, perforation             Throat- Mallampati II , mucosa clear , drainage- none, tonsils- atrophic Neck- flexible , trachea midline, no stridor , thyroid nl, carotid no bruit Chest - symmetrical excursion , unlabored           Heart/CV- RRR , no murmur , no gallop  , no rub, nl s1 s2                           - JVD- none , edema- none, stasis changes- none, varices- none           Lung-  +diminished, clear, unlabored, wheeze- none, cough- none , dullness-none, rub- none           Chest wall-  Abd-  Br/ Gen/ Rectal- Not done, not indicated Extrem- +R hand in brace for trigger finger Neuro- grossly intact to observation

## 2012-12-19 ENCOUNTER — Other Ambulatory Visit (HOSPITAL_BASED_OUTPATIENT_CLINIC_OR_DEPARTMENT_OTHER): Payer: Medicare PPO | Admitting: Lab

## 2012-12-19 ENCOUNTER — Ambulatory Visit (HOSPITAL_BASED_OUTPATIENT_CLINIC_OR_DEPARTMENT_OTHER): Payer: Medicare PPO | Admitting: Oncology

## 2012-12-19 VITALS — BP 140/81 | HR 114 | Temp 97.2°F | Resp 18 | Ht 71.5 in | Wt 136.9 lb

## 2012-12-19 DIAGNOSIS — E119 Type 2 diabetes mellitus without complications: Secondary | ICD-10-CM

## 2012-12-19 DIAGNOSIS — D473 Essential (hemorrhagic) thrombocythemia: Secondary | ICD-10-CM

## 2012-12-19 DIAGNOSIS — I509 Heart failure, unspecified: Secondary | ICD-10-CM

## 2012-12-19 LAB — CBC WITH DIFFERENTIAL/PLATELET
Eosinophils Absolute: 0.1 10*3/uL (ref 0.0–0.5)
LYMPH%: 16.2 % (ref 14.0–49.0)
MCV: 94.8 fL (ref 79.3–98.0)
MONO%: 11.3 % (ref 0.0–14.0)
NEUT#: 4.8 10*3/uL (ref 1.5–6.5)
Platelets: 222 10*3/uL (ref 140–400)
RBC: 4.6 10*6/uL (ref 4.20–5.82)

## 2012-12-19 NOTE — Progress Notes (Signed)
ID: Darrell Burnett   DOB: Jul 28, 1929  MR#: [id number]  WUJ#:[id number]  PCP: Burtis Junes, MD GYN:  SU:  OTHER MD:   INTERVAL HISTORY: Mr. Darrell Burnett returns for routine followup of his thrombocytosis. The interval history is unremarkable. He is very active in his church and also works part-time at [employer] home. His main concern is his wife, who has "terrible diabetes".  REVIEW OF SYSTEMS: He is tolerating the Hydrea with no side effects that he is aware of. He has had no bleeding, no unusual infections, no rash, and no unusual fatigue or palpitations. Sometimes his ankles swell a little. He gets short of breath particularly when walking up stairs. His sugars well-controlled. The detailed review of systems today was otherwise noncontributory  PAST MEDICAL HISTORY: Past Medical History  Diagnosis Date  . Hyperlipidemia   . Diabetes mellitus   . Cardiomyopathy     improved  . Thrombocytosis   . Heart failure     PAST SURGICAL HISTORY: Past Surgical History  Procedure Laterality Date  . Orchioplasty  1955  . Tonsillectomy    . Cardiac catheterization  2000    SHOWED MINIMAL CORONARY ATHERSCLEROSIS WITH AN ESTIMATED GLOBAL  EJECTION FRACTION AT THE TIME OF 30%    FAMILY HISTORY Family History  Problem Relation Age of Onset  . Stomach cancer Father   . Asthma Son   Step-fatther died from lung cancer (smoker), age 77. Biological father died from cancer of the stomach, age 77. Mother died 76 y/o. No brothers or sisters. 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 Married 50+ to [place] (disabled due to LBP/arthritis/ chronic bursitis). Four children, older son died from suicide, 2d son died from burns; youngest son Darrell Burnett lives in [place], PTS from [employer]; Darrell Burnett in [place] works for [employer]. One grad-daughter and 2 "greats."  ADVANCED DIRECTIVES:  HEALTH  MAINTENANCE: History  Substance Use Topics  . Smoking status: Former Smoker -- 1.00 packs/day for 45 years    Types: Cigarettes    Quit date: 02/01/2000  . Smokeless tobacco: Never Used  . Alcohol Use: 2.4 oz/week    4 Glasses of wine per week     Colonoscopy:  PAP:  Bone density:  Lipid panel:  No Known Allergies  Current Outpatient Prescriptions  Medication Sig Dispense Refill  . aspirin 81 MG tablet Take 160 mg by mouth daily.      . benazepril (LOTENSIN) 10 MG tablet Take 1 tablet (10 mg total) by mouth daily.  30 tablet  1  . carvedilol (COREG) 12.5 MG tablet TAKE 1 TABLET (12.5 MG TOTAL) BY MOUTH 2 (TWO) TIMES DAILY WITH A MEAL.  60 tablet  5  . ezetimibe (ZETIA) 10 MG tablet Take 1 tablet (10 mg total) by mouth daily.  30 tablet  3  . fish oil-omega-3 fatty acids 1000 MG capsule Take 1 g by mouth 3 (three) times daily.      . furosemide (LASIX) 40 MG tablet TAKE 1 TABLET DAILY  30 tablet  4  . glipiZIDE (GLUCOTROL) 10 MG tablet Take 10 mg by mouth 2 (two) times daily before a meal.        . hydroxyurea (HYDREA) 500 MG capsule TAKE 1 CAPSULE BY MOUTH DAILY  90 capsule  3  . LOTENSIN 10 MG tablet Take 1 tablet (10 mg total) by mouth daily.  30 tablet  6  .  Multiple Vitamin (MULTIVITAMIN) tablet Take 1 tablet by mouth daily.        Marland Kitchen Umeclidinium-Vilanterol (ANORO ELLIPTA) 62.5-25 MCG/INH AEPB Inhale 1 puff into the lungs once.  1 each  0   No current facility-administered medications for this visit.    OBJECTIVE: Elderly African American male who appears stated age 79 Vitals:   12/19/12 1121  BP: 140/81  Pulse: 114  Temp: 97.2 F (36.2 Burnett)  Resp: 18     Body mass index is 18.83 kg/(m^2).    ECOG FS: 1  Sclerae unicteric, arcus senilis bilaterally Oropharynx clear and moist No cervical or supraclavicular adenopathy Lungs fine crackles in both bases, fair excursion Heart regular rate and rhythm Abd benign, no palpable splenomegaly MSK no focal spinal  tenderness, no peripheral edema Neuro: nonfocal, well oriented, pleasant affect   LAB RESULTS: Lab Results  Component Value Date   WBC 6.8 12/19/2012   NEUTROABS 4.8 12/19/2012   HGB 14.1 12/19/2012   HCT 43.6 12/19/2012   MCV 94.8 12/19/2012   PLT 222 12/19/2012      Chemistry      Component Value Date/Time   NA 137 03/13/2012 1052   K 4.4 03/13/2012 1052   CL 98 03/13/2012 1052   CO2 31 03/13/2012 1052   BUN 24* 03/13/2012 1052   CREATININE 0.9 03/13/2012 1052      Component Value Date/Time   CALCIUM 9.4 03/13/2012 1052   ALKPHOS 49 03/11/2011 0946   AST 18 03/11/2011 0946   ALT 18 03/11/2011 0946   BILITOT 0.7 03/11/2011 0946       No results found for this basename: LABCA2    No components found with this basename: LABCA125    No results found for this basename: INR,  in the last 168 hours  Urinalysis No results found for this basename: colorurine,  appearanceur,  labspec,  phurine,  glucoseu,  hgbur,  bilirubinur,  ketonesur,  proteinur,  urobilinogen,  nitrite,  leukocytesur    STUDIES: No results found.  ASSESSMENT: 79 y.o. 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    PLAN: Mr. Darrell Burnett is doing terrific as far as his thrombocytosis is concerned. He has been on a very stable dose of Hydrea for many years, with no progression of his disease, well-controlled platelet count, no anemia, and no leukopenia.  I am making no changes in his regimen. He will see [place] again in one year. I will also check a platelet count in 6 months. He knows to call for any bleeding or any other problems that may develop before the next visit here.   MAGRINAT,Darrell Burnett    12/19/2012

## 2012-12-20 ENCOUNTER — Telehealth: Payer: Self-pay | Admitting: Oncology

## 2012-12-22 NOTE — Assessment & Plan Note (Signed)
He remains comfortable. Never saw benefit from bronchodilator medications. This is primarily emphysema. Plan-maintain exercise for stamina and continue cardiology followup. Flu vaccine today

## 2012-12-30 ENCOUNTER — Other Ambulatory Visit: Payer: Self-pay | Admitting: Oncology

## 2013-01-28 ENCOUNTER — Other Ambulatory Visit: Payer: Self-pay | Admitting: Cardiology

## 2013-03-04 ENCOUNTER — Ambulatory Visit (INDEPENDENT_AMBULATORY_CARE_PROVIDER_SITE_OTHER): Payer: Medicare PPO | Admitting: Internal Medicine

## 2013-03-04 ENCOUNTER — Telehealth: Payer: Self-pay | Admitting: Internal Medicine

## 2013-03-04 ENCOUNTER — Encounter: Payer: Self-pay | Admitting: Internal Medicine

## 2013-03-04 VITALS — BP 114/66 | HR 81 | Ht 71.5 in | Wt 137.6 lb

## 2013-03-04 DIAGNOSIS — J438 Other emphysema: Secondary | ICD-10-CM

## 2013-03-04 DIAGNOSIS — J439 Emphysema, unspecified: Secondary | ICD-10-CM

## 2013-03-04 MED ORDER — FLUTICASONE FUROATE-VILANTEROL 100-25 MCG/INH IN AEPB: 1.0000 | INHALATION_SPRAY | Freq: Every day | RESPIRATORY_TRACT | Status: AC

## 2013-03-04 MED ORDER — LEVALBUTEROL HCL 0.63 MG/3ML IN NEBU
0.6300 mg | INHALATION_SOLUTION | Freq: Once | RESPIRATORY_TRACT | Status: AC
  Administered 2013-03-04: 0.63 mg via RESPIRATORY_TRACT

## 2013-03-04 NOTE — Patient Instructions (Signed)
Neb xop 0.63  Sample Breo Ellipta inhaler   1 puff then rise mouth, once every day til you use it up.

## 2013-03-04 NOTE — Assessment & Plan Note (Signed)
Severe COPD but no obvious acute process. We discussed possible contribution of heart disease or anemia. I doubt PE Plan-nebulizer Xopenex, sample Breo. He did not want to try systemic steroids because of his diabetes

## 2013-03-04 NOTE — Telephone Encounter (Signed)
Will add patient to schedule for CY.

## 2013-03-04 NOTE — Telephone Encounter (Signed)
Spoke with pt son. He reports pt has been SOB w/ very little activity. At rst he is okay. He is out in the lobby. Reports was told by PCP come and see [place]. Please advise Dr. Annamaria Boots thanks

## 2013-03-04 NOTE — Progress Notes (Signed)
Patient ID: Darrell Burnett, male    DOB: 1929-02-27, 80 y.o.   MRN: [id number]  HPI 06/03/10-80 yoM former smoker  followed for COPD complicated by hx of CHF- Dr Doreatha Lew.  Last here April 14, 2010 for initial visit. We tried Spiriva but he couldn't tell much difference.  Blames pollen for postnasal drip causing some cough. Denies fever, sore throat. PFT 05/20/10 reviewed with him- severe obstructive disease, FEV1 1.33/ 48%, FEV1/FVC 0.45, slight response to bronchodilator, RV 147, DLCO 0.72.   12/08/10- 06/03/10-80 yoM former smoker  followed for COPD complicated by hx of CHF- Dr Doreatha Lew.  He blames "allergy" for postnasal drainage and some cough. Using Claritin only occasionally. Chest has not changed with the season and weather changes. It is not routine for him to cough. He denies chest pain or palpitation. Never feels tight or wheeze and is not using a rescue inhaler. Has not felt he needed Advair and he didn't like reading about side effects. He had a difficult cataract surgical procedure and doesn't want anything that might bother his eyes. He is being followed by Dr.Magrinat for thrombocytosis with CBC checks.  CXR-02/22/10- COPD with no acute process.  12/08/11-82 yoM former smoker  followed for COPD complicated by hx of CHF- Dr Stanford Breed SOB with [facility](going up hill) COPD Assessment Test( CAT) Score 4/40       Had flu vax No major events. Some DOE on hills and stairs. Not limiting to his life style. No cough/ wheeze. Seasonal postnasal drip. Stopped Advair- not needed. Didn't like Spiriva. PFT 05/20/10- moderate to severe obstructive disease with response to bronchodilator. FEV1 1.33/ 48%, FEV1/FVC 0.45, RV 147%, DLCO 72% CXR 11/  /12 IMPRESSION:  1. No acute findings.  2. Changes of COPD identified.  Original Report Authenticated By: Angelita Ingles, M.D.   02/24/11- 51 yoM former smoker  followed for COPD complicated by hx of CHF- Dr Stanford Breed SOB with [facility](going up  [place]) FOLLOWS FOR: OV per Dr Virl Axe having recurrent  cough Had distinct chest cold 2-3 weeks ago. Did have flu vax. Treated by Dr Kennon Holter first with Avelox, now levaquin. Still a little cough and phlegm- clear mucus, no fever, no blood..Cough syrup makes him too foggy. Discussed alternatives.  CXR 02/02/12- images reviewed w/ him. There is some linear scarring or atelectasis.  IMPRESSION:  1. No acute cardiopulmonary abnormalities.  2. Chronic bronchitic changes.  Original Report Authenticated By: Kerby Moors, M.D.   // would like repeat CXR 6 months to look at RUL and lateral//  08/28/12-  83 yoM former smoker  followed for COPD complicated by hx of CHF- Dr Stanford Breed SOB with [facility](going up [place]) FOLLOWS FOR: SOB with climbing stairs mostly or when allergies are flared up. He doesn't really see a change over time. No routine cough and no recent infection. Previous trials of standard inhaled meds made little difference- not cost effective.  Denies change in cardiac status. Still following with Dr Kennon Holter as needed for routine care.   12/07/12- 89 yoM former smoker  followed for COPD complicated by hx of CHF- Dr Stanford Breed FOLLOWS FOR:  SOB w/ exertion, same as always. Occasional minor cough only but no wheezing. Stable dyspnea with exertion. Advair never helped. CXR 08/28/12 IMPRESSION:  Generalized hyperinflation consistent with obstructive pulmonary  disease with areas of emphysematous change. No acute superimposed  pulmonary abnormality. Stable chronic findings detailed above.  Original Report Authenticated ByShanon Brow Call  03/04/13- 83 yoM former smoker  followed for  COPD complicated by hx of CHF- Dr Stanford Breed ACUTE VISIT: walked in; has been SOB for past several days; if seating the SOB calms down. Son here. Increased dyspnea on exertion for the last 2 days with no sudden events. Denies fever, sore throat, chest pain or palpitation. Eating okay. No increased cough or wheeze. No  change in chronic ankle edema. No  calf pain.  Review of Systems-See HPI Constitutional:   No-   weight loss, night sweats, fevers, chills, fatigue, lassitude. HEENT:   No-  headaches, difficulty swallowing, tooth/dental problems, sore throat,       No-  sneezing, itching, ear ache, nasal congestion, post nasal drip,  CV:  No-   chest pain, orthopnea, PND, +swelling in lower extremities,no- anasarca, dizziness, palpitations Resp: + shortness of breath with exertion or at rest.              No-   productive cough,  No non-productive cough,  No- coughing up of blood.              No-   change in color of mucus.  No- wheezing.   Skin: No-   rash or lesions. GI:  No-   heartburn, indigestion, abdominal pain, nausea, vomiting,  GU: . MS:  No-   joint pain or swelling.   Neuro-     nothing unusual Psych:  No- change in mood or affect. No depression or anxiety.  No memory loss.    Objective:   Physical Exam General- Alert, Oriented, Affect-appropriate, Distress- none acute, trim, tall. Skin- rash-none, lesions- none, excoriation- none Lymphadenopathy- none Head- atraumatic            Eyes- Gross vision intact, PERRLA, conjunctivae clear secretions            Ears- Hearing, canals-normal            Nose- Clear, no-Septal dev, mucus, polyps, erosion, perforation             Throat- Mallampati II , mucosa clear , drainage- none, tonsils- atrophic Neck- flexible , trachea midline, no stridor , thyroid nl, carotid no bruit Chest - symmetrical excursion , unlabored           Heart/CV- RRR+ weak pulse , no murmur , no gallop  , no rub, nl s1 s2. BP in his usual range                           - JVD- none , edema- none, stasis changes- none, varices- none           Lung-  +diminished, clear, unlabored, wheeze- none, cough- none , dullness-none, rub-                           None. Sat 93% room air           Chest wall-  Abd-  Br/ Gen/ Rectal- Not done, not indicated Extrem- +R hand in brace for  trigger finger Neuro- grossly intact to observation

## 2013-03-25 ENCOUNTER — Emergency Department (HOSPITAL_COMMUNITY)
Admission: EM | Admit: 2013-03-25 | Discharge: 2013-03-31 | Disposition: E | Payer: Medicare PPO | Attending: Emergency Medicine | Admitting: Emergency Medicine

## 2013-03-25 ENCOUNTER — Encounter (HOSPITAL_COMMUNITY): Payer: Self-pay | Admitting: Emergency Medicine

## 2013-03-25 ENCOUNTER — Emergency Department (HOSPITAL_COMMUNITY): Payer: Medicare PPO

## 2013-03-25 DIAGNOSIS — R141 Gas pain: Secondary | ICD-10-CM | POA: Insufficient documentation

## 2013-03-25 DIAGNOSIS — R142 Eructation: Secondary | ICD-10-CM | POA: Insufficient documentation

## 2013-03-25 DIAGNOSIS — I469 Cardiac arrest, cause unspecified: Secondary | ICD-10-CM | POA: Insufficient documentation

## 2013-03-25 DIAGNOSIS — I509 Heart failure, unspecified: Secondary | ICD-10-CM | POA: Insufficient documentation

## 2013-03-25 DIAGNOSIS — Z79899 Other long term (current) drug therapy: Secondary | ICD-10-CM | POA: Insufficient documentation

## 2013-03-25 DIAGNOSIS — R143 Flatulence: Secondary | ICD-10-CM

## 2013-03-25 DIAGNOSIS — Z7982 Long term (current) use of aspirin: Secondary | ICD-10-CM | POA: Insufficient documentation

## 2013-03-25 DIAGNOSIS — E119 Type 2 diabetes mellitus without complications: Secondary | ICD-10-CM | POA: Insufficient documentation

## 2013-03-25 DIAGNOSIS — IMO0002 Reserved for concepts with insufficient information to code with codable children: Secondary | ICD-10-CM | POA: Insufficient documentation

## 2013-03-25 DIAGNOSIS — Z862 Personal history of diseases of the blood and blood-forming organs and certain disorders involving the immune mechanism: Secondary | ICD-10-CM | POA: Insufficient documentation

## 2013-03-25 DIAGNOSIS — R092 Respiratory arrest: Secondary | ICD-10-CM

## 2013-03-25 DIAGNOSIS — Z9889 Other specified postprocedural states: Secondary | ICD-10-CM | POA: Insufficient documentation

## 2013-03-25 DIAGNOSIS — Z87891 Personal history of nicotine dependence: Secondary | ICD-10-CM | POA: Insufficient documentation

## 2013-03-25 DIAGNOSIS — R404 Transient alteration of awareness: Secondary | ICD-10-CM | POA: Insufficient documentation

## 2013-03-25 DIAGNOSIS — E785 Hyperlipidemia, unspecified: Secondary | ICD-10-CM | POA: Insufficient documentation

## 2013-03-25 LAB — I-STAT CHEM 8, ED
BUN: 26 mg/dL — AB (ref 6–23)
CALCIUM ION: 1.52 mmol/L — AB (ref 1.13–1.30)
Chloride: 102 mEq/L (ref 96–112)
Creatinine, Ser: 1.6 mg/dL — ABNORMAL HIGH (ref 0.50–1.35)
Glucose, Bld: >700 mg/dL (ref 70–99)
HEMATOCRIT: 38 % — AB (ref 39.0–52.0)
HEMOGLOBIN: 12.9 g/dL — AB (ref 13.0–17.0)
Potassium: 3.2 mEq/L — ABNORMAL LOW (ref 3.7–5.3)
SODIUM: 139 meq/L (ref 137–147)
TCO2: 17 mmol/L (ref 0–100)

## 2013-03-25 LAB — I-STAT ARTERIAL BLOOD GAS, ED
ACID-BASE DEFICIT: 18 mmol/L — AB (ref 0.0–2.0)
BICARBONATE: 14.7 meq/L — AB (ref 20.0–24.0)
O2 Saturation: 96 %
PH ART: 6.951 — AB (ref 7.350–7.450)
TCO2: 17 mmol/L (ref 0–100)
pCO2 arterial: 66.5 mmHg (ref 35.0–45.0)
pO2, Arterial: 130 mmHg — ABNORMAL HIGH (ref 80.0–100.0)

## 2013-03-25 LAB — I-STAT TROPONIN, ED: Troponin i, poc: 0.08 ng/mL (ref 0.00–0.08)

## 2013-03-25 LAB — COMPREHENSIVE METABOLIC PANEL
ALK PHOS: 99 U/L (ref 39–117)
ALT: 251 U/L — ABNORMAL HIGH (ref 0–53)
AST: 344 U/L — ABNORMAL HIGH (ref 0–37)
Albumin: 2 g/dL — ABNORMAL LOW (ref 3.5–5.2)
BILIRUBIN TOTAL: 0.3 mg/dL (ref 0.3–1.2)
BUN: 21 mg/dL (ref 6–23)
CHLORIDE: 96 meq/L (ref 96–112)
CO2: 15 meq/L — AB (ref 19–32)
CREATININE: 1.09 mg/dL (ref 0.50–1.35)
Calcium: 11.4 mg/dL — ABNORMAL HIGH (ref 8.4–10.5)
GFR, EST AFRICAN AMERICAN: 71 mL/min — AB (ref >90)
GFR, EST NON AFRICAN AMERICAN: 62 mL/min — AB (ref >90)
GLUCOSE: 715 mg/dL — AB (ref 70–99)
POTASSIUM: 3.3 meq/L — AB (ref 3.7–5.3)
Sodium: 141 mEq/L (ref 137–147)
Total Protein: 4.6 g/dL — ABNORMAL LOW (ref 6.0–8.3)

## 2013-03-25 LAB — CBC WITH DIFFERENTIAL/PLATELET
BASOS PCT: 0 % (ref 0–1)
Basophils Absolute: 0 10*3/uL (ref 0.0–0.1)
Eosinophils Absolute: 0 10*3/uL (ref 0.0–0.7)
Eosinophils Relative: 0 % (ref 0–5)
HEMATOCRIT: 38.3 % — AB (ref 39.0–52.0)
HEMOGLOBIN: 12.1 g/dL — AB (ref 13.0–17.0)
LYMPHS ABS: 3.3 10*3/uL (ref 0.7–4.0)
Lymphocytes Relative: 44 % (ref 12–46)
MCH: 30.3 pg (ref 26.0–34.0)
MCHC: 31.6 g/dL (ref 30.0–36.0)
MCV: 96 fL (ref 78.0–100.0)
MONO ABS: 0.3 10*3/uL (ref 0.1–1.0)
MONOS PCT: 5 % (ref 3–12)
NEUTROS ABS: 3.8 10*3/uL (ref 1.7–7.7)
NEUTROS PCT: 51 % (ref 43–77)
Platelets: 149 10*3/uL — ABNORMAL LOW (ref 150–400)
RBC: 3.99 MIL/uL — AB (ref 4.22–5.81)
RDW: 14.3 % (ref 11.5–15.5)
WBC: 7.4 10*3/uL (ref 4.0–10.5)

## 2013-03-25 LAB — I-STAT CG4 LACTIC ACID, ED: LACTIC ACID, VENOUS: 15 mmol/L — AB (ref 0.5–2.2)

## 2013-03-25 MED ORDER — EPINEPHRINE HCL 1 MG/ML IJ SOLN
0.5000 ug/min | INTRAMUSCULAR | Status: DC
  Administered 2013-03-25: 20 ug/min via INTRAVENOUS
  Filled 2013-03-25 (×3): qty 1

## 2013-03-25 MED ORDER — SODIUM BICARBONATE 8.4 % IV SOLN
INTRAVENOUS | Status: AC | PRN
  Administered 2013-03-25 (×2): 50 meq via INTRAVENOUS

## 2013-03-25 MED ORDER — CALCIUM CHLORIDE 10 % IV SOLN
INTRAVENOUS | Status: AC | PRN
  Administered 2013-03-25: 1 g via INTRAVENOUS

## 2013-03-25 MED ORDER — EPINEPHRINE HCL 0.1 MG/ML IJ SOSY
PREFILLED_SYRINGE | INTRAMUSCULAR | Status: AC | PRN
  Administered 2013-03-25 (×5): 1 mg via INTRAVENOUS

## 2013-03-25 NOTE — ED Notes (Signed)
All critical labs shown to [facility]

## 2013-03-25 NOTE — Procedures (Signed)
Intubation Procedure Note Darrell Burnett [id number] 12-12-1929  Procedure: Intubation Indications: Airway protection and maintenance  Procedure Details Consent: Unable to obtain consent because of emergent medical necessity. Time Out: Verified patient identification, verified procedure, site/side was marked, verified correct patient position, special equipment/implants available, medications/allergies/relevent history reviewed, required imaging and test results available.  Performed  Maximum sterile technique was used including gloves and hand hygiene.  MAC and 4    Evaluation Hemodynamic Status: Transient hypotension treated with pressors and fluid; O2 sats: stable throughout Patient's Current Condition: unstable Complications: No apparent complications Patient did tolerate procedure well. Chest X-ray ordered to verify placement.  CXR: tube position high-repostitioned.  Pt received from EMS with a King airway in place. Pt was emergently intubated during CPR with a MAC 4 and 7.5 ETT.   Dulcy Fanny 03/24/2013

## 2013-03-25 NOTE — Code Documentation (Signed)
Patient time of death occurred at 4496 per Dr Roxanne Mins.

## 2013-03-25 NOTE — Procedures (Signed)
Extubation Procedure Note  Patient Details:   Name: Darrell Burnett DOB: May 30, 1929 MRN: [id number]   Airway Documentation:  Airway 7.5 mm (Active)  Secured at (cm) 25 cm 03/22/2013 10:25 PM  Measured From Lips 03/16/2013 10:25 PM  [place] 03/17/2013 10:25 PM  Secured By [employer] 03/14/2013 10:25 PM  Site Condition Dry 03/09/2013 10:25 PM    Evaluation  O2 sats: transiently fell during during procedure Complications: No apparent complications Patient did tolerate procedure well. Bilateral Breath Sounds: Clear;Diminished   No  No more CPR and ACLS protocol terminated and pt extubated per MD.   Dulcy Fanny 03/07/2013, 11:10 PM

## 2013-03-25 NOTE — ED Notes (Signed)
Pt found pulseless and apnic by family in car.  EMS called and CPR started.  20 minutes of CPR given buy ems, 6mg  of epi, and 1 amp of d50.  Pulses returned just prior to arrival.  Pt found to be pulseless in ED, cpr started

## 2013-03-27 MED FILL — Medication: Qty: 1 | Status: AC

## 2013-03-31 DIAGNOSIS — 419620001 Death: Secondary | SNOMED CT | POA: Insufficient documentation

## 2013-03-31 NOTE — ED Provider Notes (Signed)
80 year old male was brought in by ambulance with CPR in progress. He was a passenger in a car and noted to be unresponsive when they arrived at home. EMS arrived after five minutes with no bystander CPR. Intra-osseous line was inserted, and King Airway inserted. Initial rhythm was asystole. He was given epinephrine and D50. There was report of return of pulses, but he was pulseless on arrival in the ED. He was successfully resuscitated with additional epinephrine, sodium bicarbonate, and calcium chloride. King airway was removed and replaced with endotracheal tube - procedure done by resident Dr. Judithann Graves under my direct supervision - I was present for the entire procedure. I drew blood from right femoral vein for lab analysis, and drew blood from right femoral artery for blood gas analysis. On several occasions, he lost pulses, with return of pulses with additional epinephrine. He was placed on an epinephrine drip, but eventually lost pulses in spite of epinephrine drip and he was pronounced dead at 2257. I discussed events with family, and signed the death certificate.  Cardiopulmonary Resuscitation (CPR) Procedure Note Directed/Performed by: Delora Fuel I personally directed ancillary staff and/or performed CPR in an effort to regain return of spontaneous circulation and to maintain cardiac, neuro and systemic perfusion.   CRITICAL CARE Performed by: Delora Fuel Total critical care time: 35 minutes Critical care time was exclusive of separately billable procedures and treating other patients. Specifically, critical care time was exclusive of CPR, intubation, femoral venipuncture and femoral arterial puncture. Critical care was necessary to treat or prevent imminent or life-threatening deterioration. Critical care was time spent personally by me on the following activities: development of treatment plan with patient and/or surrogate as well as nursing, discussions with consultants, evaluation of  patient's response to treatment, examination of patient, obtaining history from patient or surrogate, ordering and performing treatments and interventions, ordering and review of laboratory studies, ordering and review of radiographic studies, pulse oximetry and re-evaluation of patient's condition.  I saw and evaluated the patient, reviewed the resident's note and I agree with the findings and plan.  EKG Interpretation    Date/Time:  April 16, 2013 22:23:33 EST Ventricular Rate:  76 PR Interval:  210 QRS Duration: 166 QT Interval:  447 QTC Calculation: 503 R Axis:   96 Text Interpretation:  Unknown rhythm, irregular rate RBBB and LPFB Borderline ST depression, lateral leads Baseline wander in lead(s) V5 V6 When compared with ECG of 04/27/1998, unknown rhythm has replaced  Sinus tachycardia Right bundle branch block is now Present Left posterior fasicular block is now Present Confirmed by Roxanne Mins  MD, Cashmere Harmes ([id number]) on 03/03/2013 4:21:35 PM              Delora Fuel, MD 85/63/14 [id number]

## 2013-03-31 NOTE — Progress Notes (Signed)
Chaplain paged for family support of CPR patient in ED. Presented to patient's wife, two sons, and two friends. Was present with physician and family when physician notified family of patient's death. Assisted family with "next steps," gathered contact information for next of kin and funeral home information from family and delivered to patient's RN. Escorted family to view patient in C26. Answered their questions about "next steps." Chaplain provided hospitality, emotional and spiritual care, grief support, empathic listening, and caring presence. Family was grateful for support.   Ethelene Browns [phone]

## 2013-03-31 NOTE — ED Provider Notes (Signed)
CSN: [id number]     Arrival date & time 03/24/2013  2208 History   First MD Initiated Contact with Patient 03/04/2013 2212     Chief Complaint  Patient presents with  . Cardiac Arrest      HPI  80 yo M who presents via EMS after going unresponsive in car with his family. Family called EMS and CPR was initiated. Patient was unresponsive 5 minutes before compressions were started. Per EMS On arrival patient was in asystole. They began ACLS with 6 rounds of EPI, 1 amp of D50 and had ROSC just prior to arrival. On arrival to ED patient was noted to be pulseless CPR was commenced immediately. No further hx available at this time.   Past Medical History  Diagnosis Date  . Hyperlipidemia   . Diabetes mellitus   . Cardiomyopathy     improved  . Thrombocytosis   . Heart failure    Past Surgical History  Procedure Laterality Date  . [place]  . Tonsillectomy    . Cardiac catheterization  2000    SHOWED MINIMAL CORONARY ATHERSCLEROSIS WITH AN ESTIMATED GLOBAL  EJECTION FRACTION AT THE TIME OF 30%   Family History  Problem Relation Age of Onset  . Stomach cancer Father   . Asthma Son    History  Substance Use Topics  . Smoking status: Former Smoker -- 1.00 packs/day for 45 years    Types: Cigarettes    Quit date: 02/01/2000  . Smokeless tobacco: Never Used  . Alcohol Use: 2.4 oz/week    4 Glasses of wine per week    Review of Systems  Unable to perform ROS: Patient unresponsive      Allergies  Review of patient's allergies indicates no known allergies.  Home Medications   Current Outpatient Rx  Name  Route  Sig  Dispense  Refill  . aspirin 81 MG tablet   Oral   Take 81 mg by mouth daily.          . carvedilol (COREG) 12.5 MG tablet      TAKE 1 TABLET (12.5 MG TOTAL) BY MOUTH 2 (TWO) TIMES DAILY WITH A MEAL.   60 tablet   5   . ezetimibe (ZETIA) 10 MG tablet   Oral   Take 1 tablet (10 mg total) by mouth daily.   30 tablet   3     NEED APPOINTMENT    . fish oil-omega-3 fatty acids 1000 MG capsule   Oral   Take 1 g by mouth 2 (two) times daily.          . Fluticasone Furoate-Vilanterol (BREO ELLIPTA) 100-25 MCG/INH AEPB   Inhalation   Inhale 1 puff into the lungs daily. RINSE AFTER USE   1 each   0   . furosemide (LASIX) 40 MG tablet      TAKE 1 TABLET DAILY   30 tablet   4   . glipiZIDE (GLUCOTROL) 10 MG tablet   Oral   Take 10 mg by mouth 2 (two) times daily before a meal.           . hydroxyurea (HYDREA) 500 MG capsule      TAKE ONE CAPSULE BY MOUTH DAILY   120 capsule   2   . LOTENSIN 20 MG tablet      TAKE 1/2 TABLET (10 MG TOTAL) BY MOUTH DAILY.   15 tablet   1     Dispense as written.   . Multiple  Vitamin (MULTIVITAMIN) tablet   Oral   Take 1 tablet by mouth daily.            BP 77/57  Pulse 124  Resp 12  SpO2 100% Physical Exam  Constitutional: He appears distressed.  Cachectic. Unresponsive    HENT:  Head: Atraumatic.  Nose: Nose normal.  King airway in place   Eyes:  4 mm and sluggish   Neck: Neck supple. No tracheal deviation present.  Cardiovascular:  Pulseless   Pulmonary/Chest: No stridor.  Course breath sounds   Abdominal: He exhibits distension.  Musculoskeletal: He exhibits no edema.  Neurological:  Unresponsive. GCS 3 No corneal reflex.     ED Course  INTUBATION Date/Time: 14-Apr-2013 10:10 PM Performed by: Ruthell Rummage Authorized by: Ruthell Rummage Consent: The procedure was performed in an emergent situation. Patient identity confirmed: arm band Indications: respiratory failure and  airway protection Intubation method: direct Patient status: unconscious Laryngoscope size: Mac 4 Tube size: 7.5 mm Tube type: cuffed Number of attempts: 1 Cricoid pressure: yes Cords visualized: yes Post-procedure assessment: chest rise Breath sounds: equal Cuff inflated: yes Tube secured with: ETT holder Chest x-ray interpreted by me. Chest x-ray findings:  endotracheal tube too high Tube repositioned: tube repositioned successfully Patient tolerance: Patient tolerated the procedure well with no immediate complications.   (including critical care time) Labs Review Labs Reviewed  CBC WITH DIFFERENTIAL - Abnormal; Notable for the following:    RBC 3.99 (*)    Hemoglobin 12.1 (*)    HCT 38.3 (*)    Platelets 149 (*)    All other components within normal limits  COMPREHENSIVE METABOLIC PANEL - Abnormal; Notable for the following:    Potassium 3.3 (*)    CO2 15 (*)    Glucose, Bld 715 (*)    Calcium 11.4 (*)    Total Protein 4.6 (*)    Albumin 2.0 (*)    AST 344 (*)    ALT 251 (*)    GFR calc non Af Amer 62 (*)    GFR calc Af Amer 71 (*)    All other components within normal limits  I-STAT CG4 LACTIC ACID, ED - Abnormal; Notable for the following:    Lactic Acid, Venous 15.00 (*)    All other components within normal limits  I-STAT CHEM 8, ED - Abnormal; Notable for the following:    Potassium 3.2 (*)    BUN 26 (*)    Creatinine, Ser 1.60 (*)    Glucose, Bld >700 (*)    Calcium, Ion 1.52 (*)    Hemoglobin 12.9 (*)    HCT 38.0 (*)    All other components within normal limits  I-STAT ARTERIAL BLOOD GAS, ED - Abnormal; Notable for the following:    pH, Arterial 6.951 (*)    pCO2 arterial 66.5 (*)    pO2, Arterial 130.0 (*)    Bicarbonate 14.7 (*)    Acid-base deficit 18.0 (*)    All other components within normal limits  URINALYSIS, ROUTINE W REFLEX MICROSCOPIC  I-STAT TROPOININ, ED   Imaging Review Dg Chest Portable 1 View  04/14/2013   CLINICAL DATA:  Respiratory distress.  Intubation following CPR.  EXAM: PORTABLE CHEST - 1 VIEW  COMPARISON:  08/28/2012 chest radiograph  FINDINGS: Cardiomegaly is noted.  Endotracheal tube with tip 9.3 cm above the carina present.  Pulmonary vascular congestion and mild bibasilar atelectasis identified.  There is no evidence of pneumothorax or pleural effusion.  No acute bony abnormalities  are  identified.  Defibrillator pads overlying the lower chest noted.  IMPRESSION: Endotracheal tube with tip 9.3 cm above the carina-recommend 4-5 cm advancement.  Pulmonary vascular congestion and mild bibasilar atelectasis.   Electronically Signed   By: Hassan Rowan M.D.   On: 04/16/13 23:27    EKG Interpretation   None       MDM   Final diagnoses:  Cardiac arrest  Asystole  Respiratory arrest  Death   80 yo M who presented unresponsive in cardiac arrest. Initial rhythm on arrival was PEA. CPR started immediately. Patient received a total of 11 rounds of epi, an amp of bicarb, Calcium and an amp of D50. King airway replaced with ETT. No PTX or pericardial effusion on bedside US.  He had brief episodes of ROSC after epi was given but once the epi would wear off the patient would lose pulses. Epi gtt started. CPR continued. At 05-25-53 it was decided further attempts were futile. No pulses felt. Minimal cardiac activity on [place]. Case co managed with Dr. Roxanne Mins.  NS hydration given.    Time of death 26-May-2255  Patients family was up dated.    Ruthell Rummage, MD 03/19/2013 [phone]

## 2013-03-31 DEATH — deceased

## 2013-04-02 ENCOUNTER — Ambulatory Visit: Payer: Medicare PPO | Admitting: Internal Medicine

## 2013-04-23 ENCOUNTER — Ambulatory Visit: Payer: Medicare PPO | Admitting: Internal Medicine

## 2013-06-18 ENCOUNTER — Other Ambulatory Visit: Payer: Medicare PPO

## 2013-08-28 ENCOUNTER — Ambulatory Visit: Payer: Medicare PPO | Admitting: Internal Medicine

## 2013-12-13 ENCOUNTER — Ambulatory Visit: Payer: Medicare PPO | Admitting: Internal Medicine

## 2013-12-19 ENCOUNTER — Ambulatory Visit: Payer: Medicare PPO | Admitting: Physician Assistant

## 2013-12-19 ENCOUNTER — Other Ambulatory Visit: Payer: Medicare PPO

## 2015-01-07 IMAGING — CR DG CHEST 2V
2 series · 2 of 2 positions shown · non-contrast
Comparison: [DATE].

CLINICAL DATA: Shortness of breath.  History of smoking,
hypertension, emphysema, COPD, and diabetes.

CHEST - 2 VIEW

[view not recorded (1 of 2)]
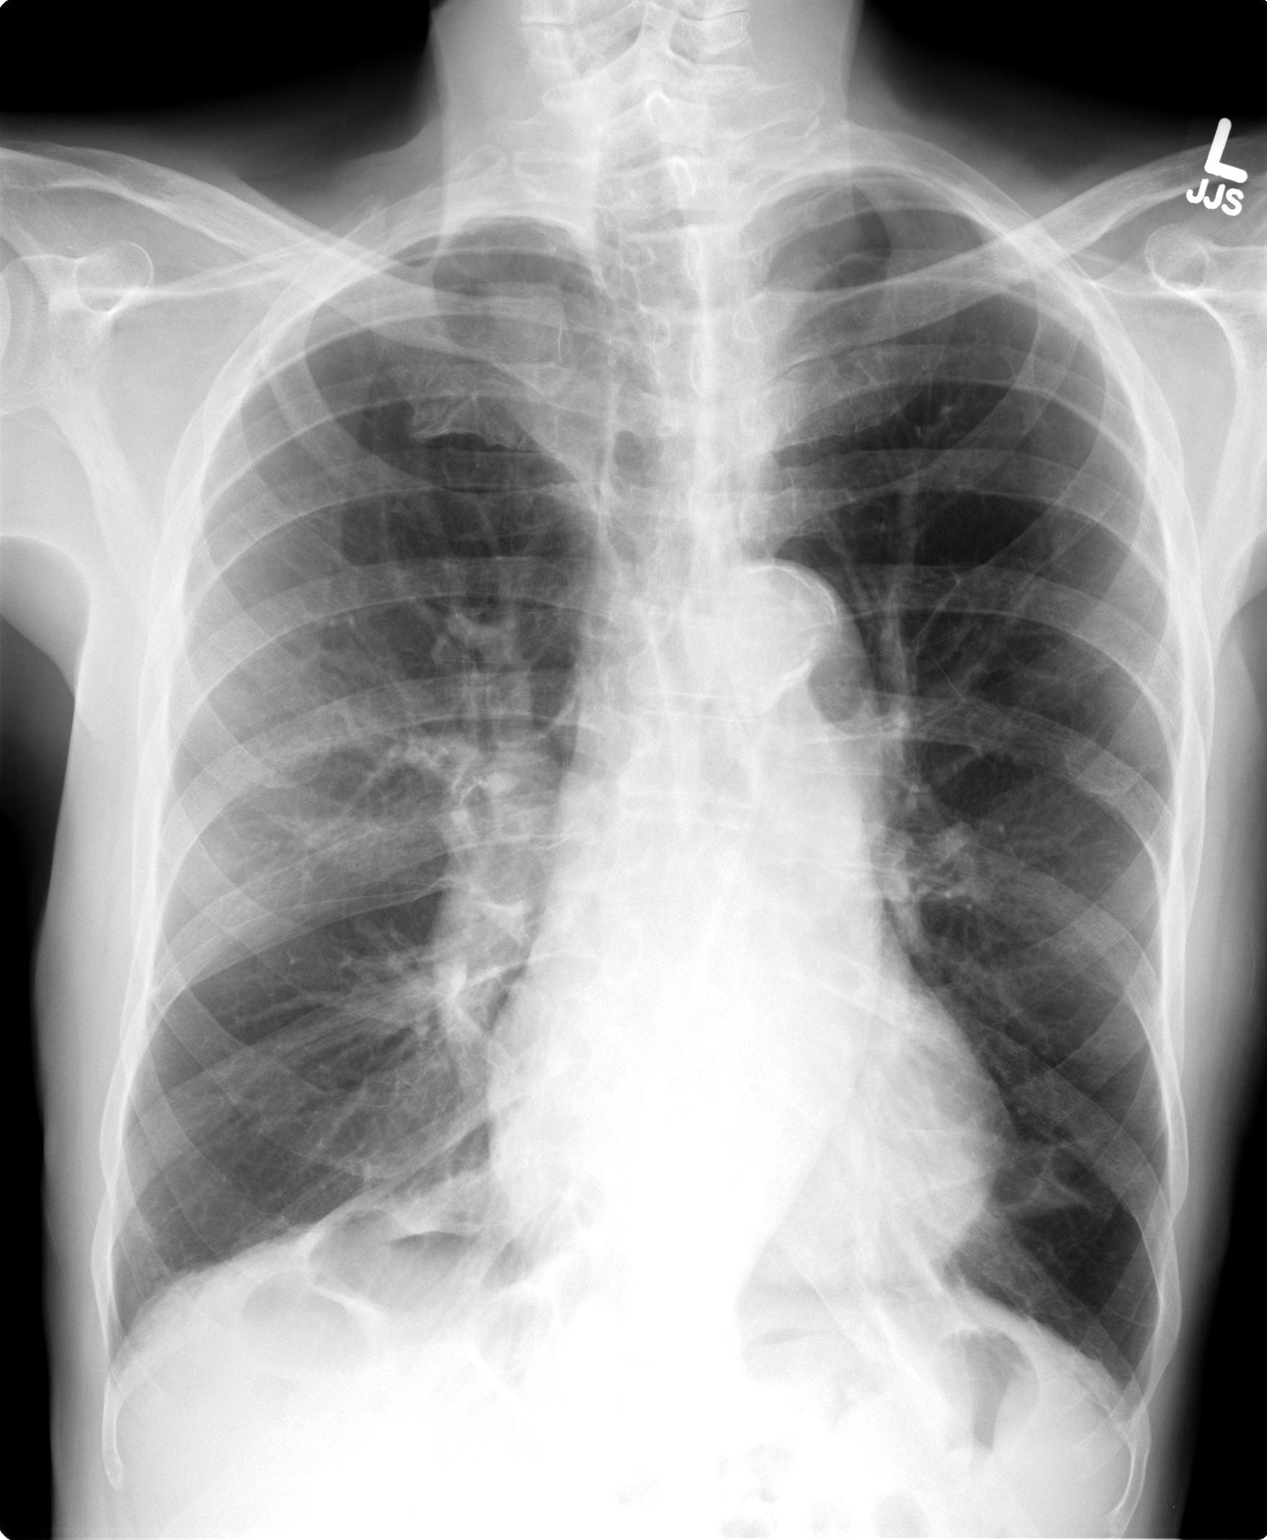

[view not recorded (2 of 2)]
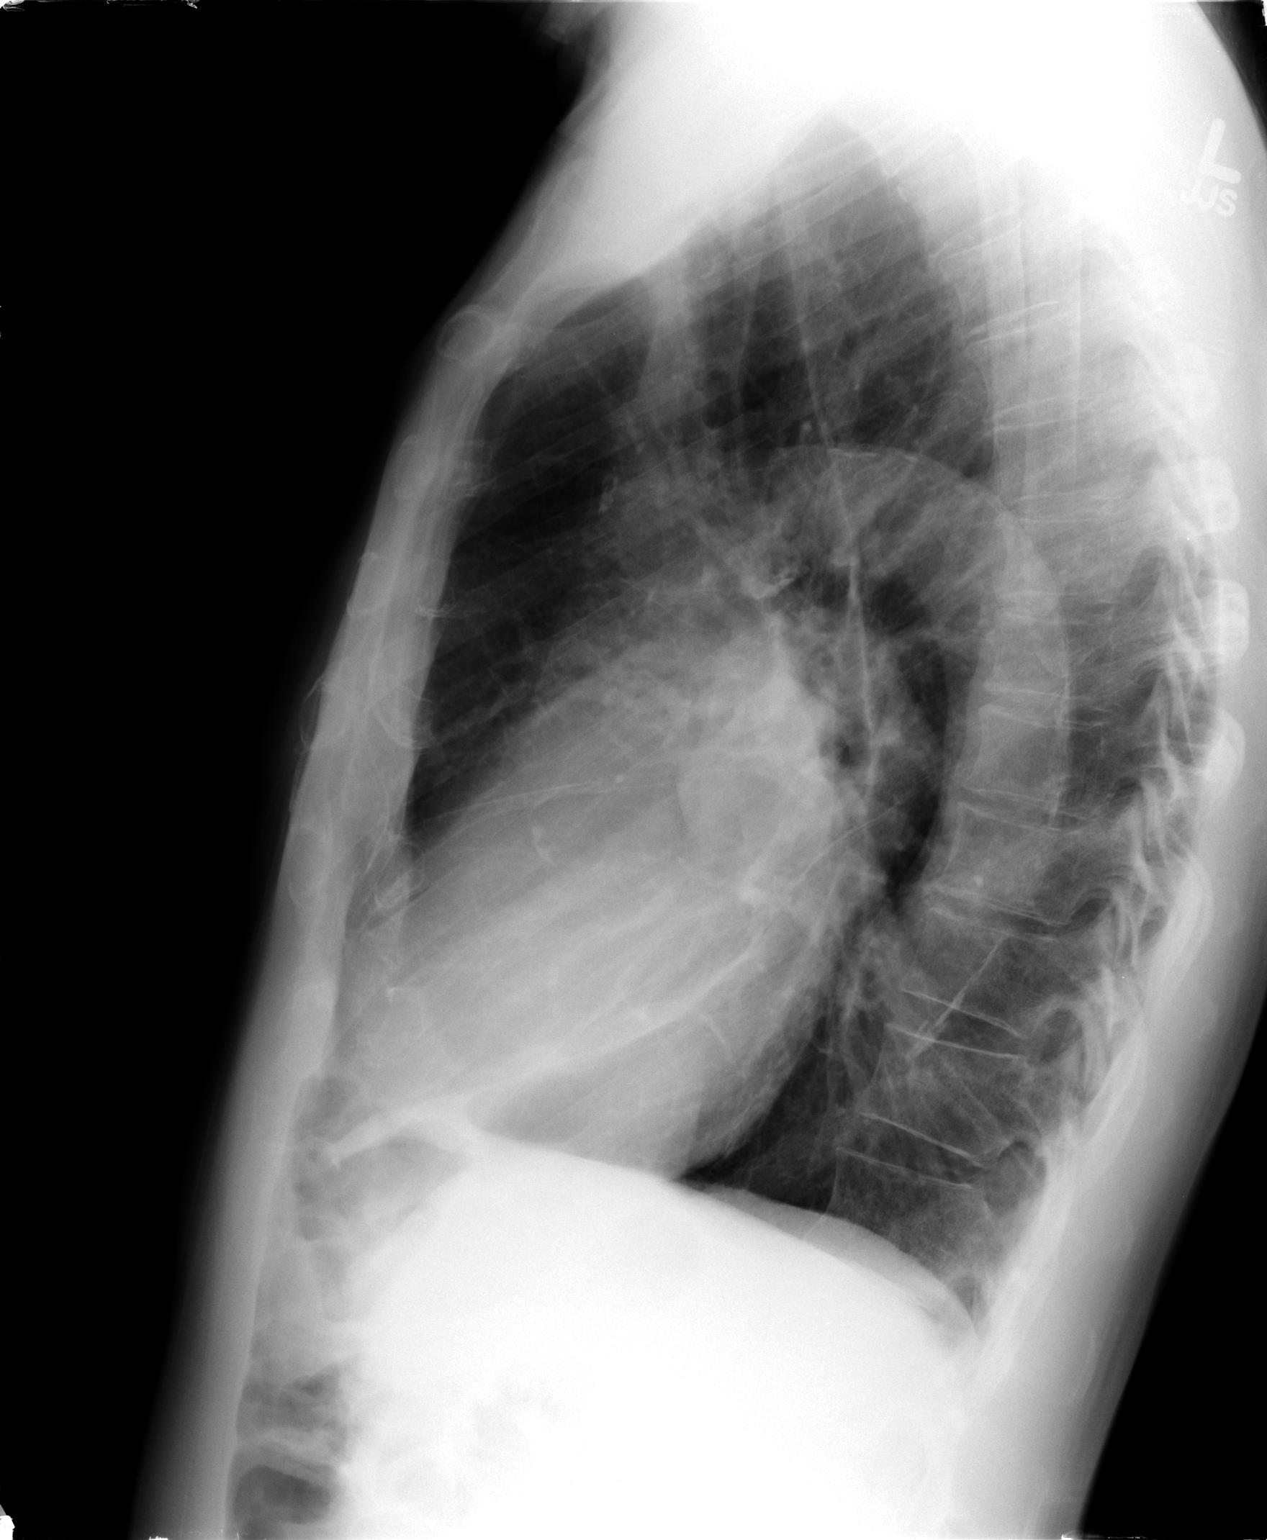

[2 of 2 positions shown; findings below may reference images not displayed]

FINDINGS: Cardiac silhouette is at the upper range of normal size.
Ectasia and nonaneurysmal calcification of the thoracic aorta are
seen.  There is tortuosity of the descending thoracic aorta.  The
diaphragm is low-lying with flattening consistent with
hyperinflation and COPD.  Emphysematous areas are present.  No
acute superimposed pulmonary abnormality is seen. No pleural
effusion is evident. Major fissure pleural thickening probably on
the right seen on lateral image is stable.  There is slightly
osteopenic appearance of bones.  There is minimal degenerative
spondylosis.
IMPRESSION: Generalized hyperinflation consistent with obstructive pulmonary
disease with areas of emphysematous change.  No acute superimposed
pulmonary abnormality.  Stable chronic findings detailed above.
